# Patient Record
Sex: Male | Born: 1945
Health system: Southern US, Community
[De-identification: ages and names within clinical notes are randomized; demographics above are authoritative.]

## PROBLEM LIST (undated history)

## (undated) DIAGNOSIS — Z8739 Personal history of other diseases of the musculoskeletal system and connective tissue: Secondary | ICD-10-CM

## (undated) DIAGNOSIS — M549 Dorsalgia, unspecified: Secondary | ICD-10-CM

## (undated) DIAGNOSIS — Z515 Encounter for palliative care: Secondary | ICD-10-CM

## (undated) DIAGNOSIS — K219 Gastro-esophageal reflux disease without esophagitis: Secondary | ICD-10-CM

## (undated) DIAGNOSIS — G8929 Other chronic pain: Secondary | ICD-10-CM

## (undated) DIAGNOSIS — I10 Essential (primary) hypertension: Secondary | ICD-10-CM

## (undated) DIAGNOSIS — C029 Malignant neoplasm of tongue, unspecified: Secondary | ICD-10-CM

## (undated) DIAGNOSIS — M542 Cervicalgia: Secondary | ICD-10-CM

## (undated) DIAGNOSIS — M1990 Unspecified osteoarthritis, unspecified site: Secondary | ICD-10-CM

## (undated) HISTORY — PX: EYE SURGERY: SHX253

## (undated) HISTORY — PX: LUMBAR DISC SURGERY: SHX700

## (undated) HISTORY — PX: CERVICAL DISC SURGERY: SHX588

---
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
## 2013-09-21 ENCOUNTER — Encounter (HOSPITAL_COMMUNITY): Payer: Self-pay | Admitting: Pharmacy Technician

## 2013-09-24 NOTE — Patient Instructions (Addendum)
Your procedure is scheduled on:  10/02/2013  Report to [facility] at   6:15   AM.  Call this number if you have problems the morning of surgery: [phone]   Remember:   Do not eat or drink :After Midnight.    Take these medicines the morning of surgery with A SIP OF WATER: Amlodipine and Lisinopril   Do not wear jewelry, make-up or nail polish.  Do not wear lotions, powders, or perfumes. You may wear deodorant.  Do not bring valuables to the hospital.  Contacts, dentures or bridgework may not be worn into surgery.  Patients discharged the day of surgery will not be allowed to drive home    Please read over the following fact sheets that you were given: Pain Booklet, Surgical Site Infection Prevention, Anesthesia Post-op Instructions and Care and Recovery After Surgery   Cataract Surgery  A cataract is a clouding of the lens of the eye. When a lens becomes cloudy, vision is reduced based on the degree and nature of the clouding. Surgery may be needed to improve vision. Surgery removes the cloudy lens and usually replaces it with a substitute lens (intraocular lens, IOL). LET YOUR EYE DOCTOR KNOW ABOUT:  Allergies to food or medicine.   Medicines taken including herbs, eyedrops, over-the-counter medicines, and creams.   Use of steroids (by mouth or creams).   Previous problems with anesthetics or numbing medicine.   History of bleeding problems or blood clots.   Previous surgery.   Other health problems, including diabetes and kidney problems.   Possibility of pregnancy, if this applies.  RISKS AND COMPLICATIONS  Infection.   Inflammation of the eyeball (endophthalmitis) that can spread to both eyes (sympathetic ophthalmia).   Poor wound healing.   If an IOL is inserted, it can later fall out of proper position. This is very uncommon.   Clouding of the part of your eye that holds an IOL in place. This is called an "after-cataract." These are uncommon, but easily  treated.  BEFORE THE PROCEDURE  Do not eat or drink anything except small amounts of water for 8 to 12 before your surgery, or as directed by your caregiver.   Unless you are told otherwise, continue any eyedrops you have been prescribed.   Talk to your primary caregiver about all other medicines that you take (both prescription and non-prescription). In some cases, you may need to stop or change medicines near the time of your surgery. This is most important if you are taking blood-thinning medicine.Do not stop medicines unless you are told to do so.   Arrange for someone to drive you to and from the procedure.   Do not put contact lenses in either eye on the day of your surgery.  PROCEDURE There is more than one method for safely removing a cataract. Your doctor can explain the differences and help determine which is best for you. Phacoemulsification surgery is the most common form of cataract surgery.  An injection is given behind the eye or eyedrops are given to make this a painless procedure.   A small cut (incision) is made on the edge of the clear, dome-shaped surface that covers the front of the eye (cornea).   A tiny probe is painlessly inserted into the eye. This device gives off ultrasound waves that soften and break up the cloudy center of the lens. This makes it easier for the cloudy lens to be removed by suction.   An IOL may be  implanted.   The normal lens of the eye is covered by a clear capsule. Part of that capsule is intentionally left in the eye to support the IOL.   Your surgeon may or may not use stitches to close the incision.  There are other forms of cataract surgery that require a larger incision and stiches to close the eye. This approach is taken in cases where the doctor feels that the cataract cannot be easily removed using phacoemulsification. AFTER THE PROCEDURE  When an IOL is implanted, it does not need care. It becomes a permanent part of your eye  and cannot be seen or felt.   Your doctor will schedule follow-up exams to check on your progress.   Review your other medicines with your doctor to see which can be resumed after surgery.   Use eyedrops or take medicine as prescribed by your doctor.  Document Released: 08/26/2011 Document Reviewed: 08/23/2011 [facility] Patient Information 2012 [place].  .Cataract Surgery Care After Refer to this sheet in the next few weeks. These instructions provide you with information on caring for yourself after your procedure. Your caregiver may also give you more specific instructions. Your treatment has been planned according to current medical practices, but problems sometimes occur. Call your caregiver if you have any problems or questions after your procedure.  HOME CARE INSTRUCTIONS   Avoid strenuous activities as directed by your caregiver.   Ask your caregiver when you can resume driving.   Use eyedrops or other medicines to help healing and control pressure inside your eye as directed by your caregiver.   Only take over-the-counter or prescription medicines for pain, discomfort, or fever as directed by your caregiver.   Do not to touch or rub your eyes.   You may be instructed to use a protective shield during the first few days and nights after surgery. If not, wear sunglasses to protect your eyes. This is to protect the eye from pressure or from being accidentally bumped.   Keep the area around your eye clean and dry. Avoid swimming or allowing water to hit you directly in the face while showering. Keep soap and shampoo out of your eyes.   Do not bend or lift heavy objects. Bending increases pressure in the eye. You can walk, climb stairs, and do light household chores.   Do not put a contact lens into the eye that had surgery until your caregiver says it is okay to do so.   Ask your doctor when you can return to work. This will depend on the kind of work that you do. If you  work in a dusty environment, you may be advised to wear protective eyewear for a period of time.   Ask your caregiver when it will be safe to engage in sexual activity.   Continue with your regular eye exams as directed by your caregiver.  What to expect:  It is normal to feel itching and mild discomfort for a few days after cataract surgery. Some fluid discharge is also common, and your eye may be sensitive to light and touch.   After 1 to 2 days, even moderate discomfort should disappear. In most cases, healing will take about 6 weeks.   If you received an intraocular lens (IOL), you may notice that colors are very bright or have a blue tinge. Also, if you have been in bright sunlight, everything may appear reddish for a few hours. If you see these color tinges, it is because  your lens is clear and no longer cloudy. Within a few months after receiving an IOL, these extra colors should go away. When you have healed, you will probably need new glasses.  SEEK MEDICAL CARE IF:   You have increased bruising around your eye.   You have discomfort not helped by medicine.  SEEK IMMEDIATE MEDICAL CARE IF:   You have a fever.   You have a worsening or sudden vision loss.   You have redness, swelling, or increasing pain in the eye.   You have a thick discharge from the eye that had surgery.  MAKE SURE YOU:  Understand these instructions.   Will watch your condition.   Will get help right away if you are not doing well or get worse.  Document Released: 03/26/2005 Document Revised: 08/26/2011 Document Reviewed: 04/30/2011 [facility] Patient Information 2012 [place].

## 2013-09-25 ENCOUNTER — Encounter (HOSPITAL_COMMUNITY)
Admission: RE | Admit: 2013-09-25 | Discharge: 2013-09-25 | Disposition: A | Discharge status: Home / Self Care | Payer: Medicare Other | Source: Ambulatory Visit | Attending: Ophthalmology | Admitting: Ophthalmology

## 2013-09-25 ENCOUNTER — Encounter (HOSPITAL_COMMUNITY): Payer: Self-pay

## 2013-09-25 ENCOUNTER — Other Ambulatory Visit: Payer: Self-pay

## 2013-09-25 DIAGNOSIS — Z01818 Encounter for other preprocedural examination: Secondary | ICD-10-CM | POA: Insufficient documentation

## 2013-09-25 DIAGNOSIS — Z01810 Encounter for preprocedural cardiovascular examination: Secondary | ICD-10-CM | POA: Insufficient documentation

## 2013-09-25 DIAGNOSIS — Z01812 Encounter for preprocedural laboratory examination: Secondary | ICD-10-CM | POA: Insufficient documentation

## 2013-09-25 HISTORY — DX: Personal history of other diseases of the musculoskeletal system and connective tissue: Z87.39

## 2013-09-25 HISTORY — DX: Cervicalgia: M54.2

## 2013-09-25 HISTORY — DX: Other chronic pain: G89.29

## 2013-09-25 HISTORY — DX: Dorsalgia, unspecified: M54.9

## 2013-09-25 HISTORY — DX: Unspecified osteoarthritis, unspecified site: M19.90

## 2013-09-25 HISTORY — DX: Essential (primary) hypertension: I10

## 2013-09-25 LAB — BASIC METABOLIC PANEL
BUN: 12 mg/dL (ref 6–23)
CO2: 30 mEq/L (ref 19–32)
Calcium: 9.2 mg/dL (ref 8.4–10.5)
Chloride: 91 mEq/L — ABNORMAL LOW (ref 96–112)
Creatinine, Ser: 0.8 mg/dL (ref 0.50–1.35)
GFR calc Af Amer: >90 mL/min (ref >90)
Glucose, Bld: 97 mg/dL (ref 70–99)
POTASSIUM: 5.1 meq/L (ref 3.7–5.3)
SODIUM: 130 meq/L — AB (ref 137–147)

## 2013-09-25 LAB — CBC WITH DIFFERENTIAL/PLATELET
BASOS PCT: 0 % (ref 0–1)
Basophils Absolute: 0 10*3/uL (ref 0.0–0.1)
Eosinophils Absolute: 0.1 10*3/uL (ref 0.0–0.7)
Eosinophils Relative: 1 % (ref 0–5)
HCT: 43.4 % (ref 39.0–52.0)
Hemoglobin: 15.4 g/dL (ref 13.0–17.0)
Lymphocytes Relative: 23 % (ref 12–46)
Lymphs Abs: 1.9 10*3/uL (ref 0.7–4.0)
MCH: 35.3 pg — ABNORMAL HIGH (ref 26.0–34.0)
MCHC: 35.5 g/dL (ref 30.0–36.0)
MCV: 99.5 fL (ref 78.0–100.0)
Monocytes Absolute: 0.6 10*3/uL (ref 0.1–1.0)
Monocytes Relative: 7 % (ref 3–12)
NEUTROS PCT: 69 % (ref 43–77)
Neutro Abs: 5.8 10*3/uL (ref 1.7–7.7)
PLATELETS: 294 10*3/uL (ref 150–400)
RBC: 4.36 MIL/uL (ref 4.22–5.81)
RDW: 13.4 % (ref 11.5–15.5)
WBC: 8.4 10*3/uL (ref 4.0–10.5)

## 2013-09-25 NOTE — Progress Notes (Signed)
09/25/13 0914  OBSTRUCTIVE SLEEP APNEA  Have you ever been diagnosed with sleep apnea through a sleep study? No  Do you snore loudly (loud enough to be heard through closed doors)?  0  Do you often feel tired, fatigued, or sleepy during the daytime? 0  Has anyone observed you stop breathing during your sleep? 1  Do you have, or are you being treated for high blood pressure? 1  BMI more than 35 kg/m2? 0  Age over 66 years old? 1  Neck circumference greater than 40 cm/18 inches? 0  Gender: 1  Obstructive Sleep Apnea Score 4  Score 4 or greater  Results sent to PCP

## 2013-10-01 ENCOUNTER — Encounter (HOSPITAL_COMMUNITY): Payer: Self-pay | Admitting: Pharmacy Technician

## 2013-10-01 MED ORDER — TETRACAINE HCL 0.5 % OP SOLN
OPHTHALMIC | Status: AC
  Filled 2013-10-01: qty 2

## 2013-10-01 MED ORDER — PHENYLEPHRINE HCL 2.5 % OP SOLN
OPHTHALMIC | Status: AC
  Filled 2013-10-01: qty 15

## 2013-10-01 MED ORDER — CYCLOPENTOLATE-PHENYLEPHRINE OP SOLN OPTIME - NO CHARGE
OPHTHALMIC | Status: AC
  Filled 2013-10-01: qty 2

## 2013-10-01 MED ORDER — KETOROLAC TROMETHAMINE 0.5 % OP SOLN
OPHTHALMIC | Status: AC
  Filled 2013-10-01: qty 5

## 2013-10-02 ENCOUNTER — Encounter (HOSPITAL_COMMUNITY): Payer: Medicare Other | Admitting: Anesthesiology

## 2013-10-02 ENCOUNTER — Encounter (HOSPITAL_COMMUNITY): Payer: Self-pay | Admitting: *Deleted

## 2013-10-02 ENCOUNTER — Ambulatory Visit (HOSPITAL_COMMUNITY)
Admission: RE | Admit: 2013-10-02 | Discharge: 2013-10-02 | Disposition: A | Discharge status: Home / Self Care | Payer: Medicare Other | Source: Ambulatory Visit | Attending: Ophthalmology | Admitting: Ophthalmology

## 2013-10-02 ENCOUNTER — Encounter (HOSPITAL_COMMUNITY)
Admission: RE | Disposition: A | Discharge status: Home / Self Care | Payer: Self-pay | Source: Ambulatory Visit | Attending: Ophthalmology

## 2013-10-02 ENCOUNTER — Ambulatory Visit (HOSPITAL_COMMUNITY): Payer: Medicare Other | Admitting: Anesthesiology

## 2013-10-02 DIAGNOSIS — I10 Essential (primary) hypertension: Secondary | ICD-10-CM | POA: Insufficient documentation

## 2013-10-02 DIAGNOSIS — J449 Chronic obstructive pulmonary disease, unspecified: Secondary | ICD-10-CM | POA: Insufficient documentation

## 2013-10-02 DIAGNOSIS — H2510 Age-related nuclear cataract, unspecified eye: Secondary | ICD-10-CM | POA: Insufficient documentation

## 2013-10-02 DIAGNOSIS — J4489 Other specified chronic obstructive pulmonary disease: Secondary | ICD-10-CM | POA: Insufficient documentation

## 2013-10-02 HISTORY — PX: CATARACT EXTRACTION W/PHACO: SHX586

## 2013-10-02 SURGERY — PHACOEMULSIFICATION, CATARACT, WITH IOL INSERTION
Anesthesia: Monitor Anesthesia Care | Site: Eye | Laterality: Left

## 2013-10-02 MED ORDER — MIDAZOLAM HCL 2 MG/2ML IJ SOLN
1.0000 mg | INTRAMUSCULAR | Status: DC | PRN
  Administered 2013-10-02: 2 mg via INTRAVENOUS

## 2013-10-02 MED ORDER — CYCLOPENTOLATE-PHENYLEPHRINE 0.2-1 % OP SOLN
1.0000 [drp] | OPHTHALMIC | Status: AC
  Administered 2013-10-02 (×3): 1 [drp] via OPHTHALMIC

## 2013-10-02 MED ORDER — FENTANYL CITRATE 0.05 MG/ML IJ SOLN
INTRAMUSCULAR | Status: DC | PRN
  Administered 2013-10-02 (×2): 25 ug via INTRAVENOUS

## 2013-10-02 MED ORDER — BSS IO SOLN
INTRAOCULAR | Status: DC | PRN
  Administered 2013-10-02: 15 mL via INTRAOCULAR

## 2013-10-02 MED ORDER — BSS IO SOLN
INTRAOCULAR | Status: DC | PRN
  Administered 2013-10-02: 08:00:00

## 2013-10-02 MED ORDER — LIDOCAINE HCL (PF) 1 % IJ SOLN
INTRAMUSCULAR | Status: AC
  Filled 2013-10-02: qty 2

## 2013-10-02 MED ORDER — TETRACAINE HCL 0.5 % OP SOLN
1.0000 [drp] | OPHTHALMIC | Status: AC
  Administered 2013-10-02 (×3): 1 [drp] via OPHTHALMIC

## 2013-10-02 MED ORDER — LACTATED RINGERS IV SOLN
INTRAVENOUS | Status: DC
  Administered 2013-10-02: 07:00:00 via INTRAVENOUS

## 2013-10-02 MED ORDER — PROVISC 10 MG/ML IO SOLN
INTRAOCULAR | Status: DC | PRN
  Administered 2013-10-02: 0.85 mL via INTRAOCULAR

## 2013-10-02 MED ORDER — MIDAZOLAM HCL 2 MG/2ML IJ SOLN
INTRAMUSCULAR | Status: AC
  Filled 2013-10-02: qty 2

## 2013-10-02 MED ORDER — PHENYLEPHRINE HCL 2.5 % OP SOLN
1.0000 [drp] | OPHTHALMIC | Status: AC
  Administered 2013-10-02 (×3): 1 [drp] via OPHTHALMIC

## 2013-10-02 MED ORDER — TETRACAINE 0.5 % OP SOLN OPTIME - NO CHARGE
OPHTHALMIC | Status: DC | PRN
  Administered 2013-10-02: 1 [drp] via OPHTHALMIC

## 2013-10-02 MED ORDER — GLYCOPYRROLATE 0.2 MG/ML IJ SOLN
0.2000 mg | Freq: Once | INTRAMUSCULAR | Status: AC
  Administered 2013-10-02: 0.2 mg via INTRAVENOUS

## 2013-10-02 MED ORDER — KETOROLAC TROMETHAMINE 0.5 % OP SOLN
1.0000 [drp] | OPHTHALMIC | Status: AC
  Administered 2013-10-02 (×3): 1 [drp] via OPHTHALMIC

## 2013-10-02 MED ORDER — FENTANYL CITRATE 0.05 MG/ML IJ SOLN
25.0000 ug | INTRAMUSCULAR | Status: AC
  Administered 2013-10-02 (×2): 25 ug via INTRAVENOUS

## 2013-10-02 MED ORDER — GLYCOPYRROLATE 0.2 MG/ML IJ SOLN
INTRAMUSCULAR | Status: AC
  Filled 2013-10-02: qty 1

## 2013-10-02 MED ORDER — EPINEPHRINE HCL 1 MG/ML IJ SOLN
INTRAMUSCULAR | Status: AC
  Filled 2013-10-02: qty 1

## 2013-10-02 MED ORDER — FENTANYL CITRATE 0.05 MG/ML IJ SOLN
INTRAMUSCULAR | Status: AC
  Filled 2013-10-02: qty 2

## 2013-10-02 SURGICAL SUPPLY — 24 items
CAPSULAR TENSION RING-AMO (OPHTHALMIC RELATED) IMPLANT
CLOTH BEACON ORANGE TIMEOUT ST (SAFETY) ×3 IMPLANT
EYE SHIELD UNIVERSAL CLEAR (GAUZE/BANDAGES/DRESSINGS) ×3 IMPLANT
GLOVE BIO SURGEON STRL SZ 6.5 (GLOVE) ×2 IMPLANT
GLOVE BIO SURGEONS STRL SZ 6.5 (GLOVE) ×1
GLOVE ECLIPSE 6.5 STRL STRAW (GLOVE) IMPLANT
GLOVE ECLIPSE 7.0 STRL STRAW (GLOVE) IMPLANT
GLOVE EXAM NITRILE LRG STRL (GLOVE) ×3 IMPLANT
GLOVE EXAM NITRILE MD LF STRL (GLOVE) IMPLANT
GLOVE SKINSENSE NS SZ6.5 (GLOVE)
GLOVE SKINSENSE STRL SZ6.5 (GLOVE) IMPLANT
HEALON 5 0.6 ML (INTRAOCULAR LENS) IMPLANT
KIT VITRECTOMY (OPHTHALMIC RELATED) IMPLANT
PAD ARMBOARD 7.5X6 YLW CONV (MISCELLANEOUS) ×3 IMPLANT
PROC W NO LENS (INTRAOCULAR LENS)
PROC W SPEC LENS (INTRAOCULAR LENS)
PROCESS W NO LENS (INTRAOCULAR LENS) IMPLANT
PROCESS W SPEC LENS (INTRAOCULAR LENS) IMPLANT
RING MALYGIN (MISCELLANEOUS) IMPLANT
SIGHTPATH CAT PROC W REG LENS (Ophthalmic Related) ×3 IMPLANT
TAPE SURG TRANSPARENT 2IN (GAUZE/BANDAGES/DRESSINGS) ×1 IMPLANT
TAPE TRANSPARENT 2IN (GAUZE/BANDAGES/DRESSINGS) ×2
VISCOELASTIC ADDITIONAL (OPHTHALMIC RELATED) IMPLANT
WATER STERILE IRR 250ML POUR (IV SOLUTION) ×3 IMPLANT

## 2013-10-02 NOTE — H&P (Signed)
The patient was re examined and there is no change in the patients condition since the original H and P. 

## 2013-10-02 NOTE — Anesthesia Postprocedure Evaluation (Signed)
  Anesthesia Post-op Note  Patient: Kevin Durham  Procedure(s) Performed: Procedure(s) (LRB): CATARACT EXTRACTION PHACO AND INTRAOCULAR LENS PLACEMENT (IOC) (Left)  Patient Location:  Short Stay  Anesthesia Type: MAC  Level of Consciousness: awake  Airway and Oxygen Therapy: Patient Spontanous Breathing  Post-op Pain: none  Post-op Assessment: Post-op Vital signs reviewed, Patient's Cardiovascular Status Stable, Respiratory Function Stable, Patent Airway, No signs of Nausea or vomiting and Pain level controlled  Post-op Vital Signs: Reviewed and stable  Complications: No apparent anesthesia complications

## 2013-10-02 NOTE — Transfer of Care (Signed)
Immediate Anesthesia Transfer of Care Note  Patient: Kevin Durham  Procedure(s) Performed: Procedure(s) (LRB): CATARACT EXTRACTION PHACO AND INTRAOCULAR LENS PLACEMENT (IOC) (Left)  Patient Location: Shortstay  Anesthesia Type: MAC  Level of Consciousness: awake  Airway & Oxygen Therapy: Patient Spontanous Breathing   Post-op Assessment: Report given to PACU RN, Post -op Vital signs reviewed and stable and Patient moving all extremities  Post vital signs: Reviewed and stable  Complications: No apparent anesthesia complications

## 2013-10-02 NOTE — Anesthesia Preprocedure Evaluation (Addendum)
Anesthesia Evaluation  Patient identified by MRN, date of birth, ID band Patient awake    Reviewed: Allergy & Precautions, H&P , NPO status , Patient's Chart, lab work & pertinent test results  Airway Mallampati: II TM Distance: >3 FB Neck ROM: Limited    Dental  (+) Teeth Intact   Pulmonary COPDCurrent Smoker,  breath sounds clear to auscultation        Cardiovascular hypertension, Pt. on medications Rhythm:Regular Rate:Normal     Neuro/Psych    GI/Hepatic negative GI ROS,   Endo/Other    Renal/GU      Musculoskeletal   Abdominal   Peds  Hematology   Anesthesia Other Findings Gout   Reproductive/Obstetrics                          Anesthesia Physical Anesthesia Plan  ASA: III  Anesthesia Plan: MAC   Post-op Pain Management:    Induction: Intravenous  Airway Management Planned: Nasal Cannula  Additional Equipment:   Intra-op Plan:   Post-operative Plan:   Informed Consent: I have reviewed the patients History and Physical, chart, labs and discussed the procedure including the risks, benefits and alternatives for the proposed anesthesia with the patient or authorized representative who has indicated his/her understanding and acceptance.     Plan Discussed with:   Anesthesia Plan Comments:         Anesthesia Quick Evaluation

## 2013-10-02 NOTE — Anesthesia Procedure Notes (Signed)
Procedure Name: MAC Date/Time: 10/02/2013 7:25 AM Performed by: Vista Deck Pre-anesthesia Checklist: Patient identified, Emergency Drugs available, Suction available, Timeout performed and Patient being monitored Patient Re-evaluated:Patient Re-evaluated prior to inductionOxygen Delivery Method: Nasal Cannula

## 2013-10-02 NOTE — Op Note (Signed)
Patient brought to the operating room and prepped and draped in the usual manner.  Lid speculum inserted in left eye.  Stab incision made at the twelve o'clock position.  Provisc instilled in the anterior chamber.   A 2.4 mm. Stab incision was made temporally.  An anterior capsulotomy was done with a bent 25 gauge needle.  The nucleus was hydrodissected.  The Phaco tip was inserted in the anterior chamber and the nucleus was emulsified.  CDE was 18.90.  The cortical material was then removed with the I and A tip.  Posterior capsule was the polished.  The anterior chamber was deepened with Provisc.  A 14.5 Diopter Rayner 570C IOL was then inserted in the capsular bag.  Provisc was then removed with the I and A tip.  The wound was then hydrated.  Patient sent to the Recovery Room in good condition with follow up in my office.  Preoperative Diagnosis:  Nuclear Cataract OS Postoperative Diagnosis:  Same Procedure name: Kelman Phacoemulsification OS with IOL

## 2013-10-02 NOTE — Discharge Instructions (Signed)
Kevin Durham  10/02/2013           [facility] Instructions [place] [address]      1. Avoid closing eyes tightly. One often closes the eye tightly when laughing, talking, sneezing, coughing or if they feel irritated. At these times, you should be careful not to close your eyes tightly.  2. Instill eye drops as instructed. To instill drops in your eye, open it, look up and have someone gently pull the lower lid down and instill a couple of drops inside the lower lid.  3. Do not touch upper lid.  4. Take Advil or Tylenol for pain.  5. You may use either eye for near work, such as reading or sewing and you may watch television.  6. You may have your hair done at the beauty parlor at any time.  7. Wear dark glasses with or without your own glasses if you are in bright light.  8. Call our office at [phone] or [phone] if you have sharp pain in your eye or unusual symptoms.  9. Do not be concerned because vision in the operative eye is not good. It will not be good, no matter how successful the operation, until you get a special lens for it. Your old glasses will not be suited to the new eye that was operated on and you will not be ready for a new lens for about a month.  10. Follow up at the [facility] office.    I have received a copy of the above instructions and will follow them.

## 2013-10-03 ENCOUNTER — Encounter (HOSPITAL_COMMUNITY): Payer: Self-pay | Admitting: Ophthalmology

## 2013-10-09 ENCOUNTER — Encounter (HOSPITAL_COMMUNITY)
Admission: RE | Admit: 2013-10-09 | Discharge: 2013-10-09 | Disposition: A | Discharge status: Home / Self Care | Payer: Medicare Other | Source: Ambulatory Visit | Attending: Ophthalmology | Admitting: Ophthalmology

## 2013-10-09 ENCOUNTER — Encounter (HOSPITAL_COMMUNITY): Payer: Self-pay

## 2013-10-09 MED ORDER — ONDANSETRON HCL 4 MG/2ML IJ SOLN
4.0000 mg | Freq: Once | INTRAMUSCULAR | Status: AC | PRN
Start: 2013-10-09 — End: 2013-10-09

## 2013-10-09 MED ORDER — FENTANYL CITRATE 0.05 MG/ML IJ SOLN
25.0000 ug | INTRAMUSCULAR | Status: DC | PRN
  Administered 2013-10-16: 25 ug via INTRAVENOUS

## 2013-10-09 MED ORDER — LACTATED RINGERS IV SOLN
INTRAVENOUS | Status: DC
Start: 2013-10-09 — End: 2013-10-16
  Administered 2013-10-16: 1000 mL via INTRAVENOUS

## 2013-10-15 MED ORDER — KETOROLAC TROMETHAMINE 0.5 % OP SOLN
OPHTHALMIC | Status: AC
  Filled 2013-10-15: qty 5

## 2013-10-15 MED ORDER — TETRACAINE HCL 0.5 % OP SOLN
OPHTHALMIC | Status: AC
  Filled 2013-10-15: qty 2

## 2013-10-15 MED ORDER — PHENYLEPHRINE HCL 2.5 % OP SOLN
OPHTHALMIC | Status: AC
  Filled 2013-10-15: qty 15

## 2013-10-15 MED ORDER — CYCLOPENTOLATE-PHENYLEPHRINE OP SOLN OPTIME - NO CHARGE
OPHTHALMIC | Status: AC
  Filled 2013-10-15: qty 2

## 2013-10-16 ENCOUNTER — Encounter (HOSPITAL_COMMUNITY): Payer: Self-pay | Admitting: *Deleted

## 2013-10-16 ENCOUNTER — Ambulatory Visit (HOSPITAL_COMMUNITY)
Admission: RE | Admit: 2013-10-16 | Discharge: 2013-10-16 | Disposition: A | Discharge status: Home / Self Care | Payer: Medicare Other | Source: Ambulatory Visit | Attending: Ophthalmology | Admitting: Ophthalmology

## 2013-10-16 ENCOUNTER — Ambulatory Visit (HOSPITAL_COMMUNITY): Payer: Medicare Other | Admitting: Anesthesiology

## 2013-10-16 ENCOUNTER — Encounter (HOSPITAL_COMMUNITY)
Admission: RE | Disposition: A | Discharge status: Home / Self Care | Payer: Self-pay | Source: Ambulatory Visit | Attending: Ophthalmology

## 2013-10-16 ENCOUNTER — Encounter (HOSPITAL_COMMUNITY): Payer: Medicare Other | Admitting: Anesthesiology

## 2013-10-16 DIAGNOSIS — J449 Chronic obstructive pulmonary disease, unspecified: Secondary | ICD-10-CM | POA: Insufficient documentation

## 2013-10-16 DIAGNOSIS — H2510 Age-related nuclear cataract, unspecified eye: Secondary | ICD-10-CM | POA: Insufficient documentation

## 2013-10-16 DIAGNOSIS — I10 Essential (primary) hypertension: Secondary | ICD-10-CM | POA: Insufficient documentation

## 2013-10-16 DIAGNOSIS — F17200 Nicotine dependence, unspecified, uncomplicated: Secondary | ICD-10-CM | POA: Insufficient documentation

## 2013-10-16 DIAGNOSIS — J4489 Other specified chronic obstructive pulmonary disease: Secondary | ICD-10-CM | POA: Insufficient documentation

## 2013-10-16 HISTORY — PX: CATARACT EXTRACTION W/PHACO: SHX586

## 2013-10-16 SURGERY — PHACOEMULSIFICATION, CATARACT, WITH IOL INSERTION
Anesthesia: Monitor Anesthesia Care | Site: Eye | Laterality: Right

## 2013-10-16 MED ORDER — PHENYLEPHRINE HCL 2.5 % OP SOLN
1.0000 [drp] | OPHTHALMIC | Status: AC
  Administered 2013-10-16 (×3): 1 [drp] via OPHTHALMIC

## 2013-10-16 MED ORDER — FENTANYL CITRATE 0.05 MG/ML IJ SOLN: 25.0000 ug | INTRAMUSCULAR | Status: DC | PRN

## 2013-10-16 MED ORDER — EPINEPHRINE HCL 1 MG/ML IJ SOLN
INTRAMUSCULAR | Status: AC
  Filled 2013-10-16: qty 1

## 2013-10-16 MED ORDER — FENTANYL CITRATE 0.05 MG/ML IJ SOLN
INTRAMUSCULAR | Status: AC
  Filled 2013-10-16: qty 2

## 2013-10-16 MED ORDER — ONDANSETRON HCL 4 MG/2ML IJ SOLN: 4.0000 mg | Freq: Once | INTRAMUSCULAR | Status: DC | PRN

## 2013-10-16 MED ORDER — TETRACAINE 0.5 % OP SOLN OPTIME - NO CHARGE
OPHTHALMIC | Status: DC | PRN
  Administered 2013-10-16: 1 [drp] via OPHTHALMIC

## 2013-10-16 MED ORDER — FENTANYL CITRATE 0.05 MG/ML IJ SOLN
INTRAMUSCULAR | Status: AC
Start: 2013-10-16 — End: 2013-10-16
  Filled 2013-10-16: qty 2

## 2013-10-16 MED ORDER — KETOROLAC TROMETHAMINE 0.5 % OP SOLN
1.0000 [drp] | OPHTHALMIC | Status: AC
  Administered 2013-10-16 (×3): 1 [drp] via OPHTHALMIC

## 2013-10-16 MED ORDER — BSS IO SOLN
INTRAOCULAR | Status: DC | PRN
  Administered 2013-10-16: 15 mL via INTRAOCULAR

## 2013-10-16 MED ORDER — MIDAZOLAM HCL 2 MG/2ML IJ SOLN
1.0000 mg | INTRAMUSCULAR | Status: DC | PRN
  Administered 2013-10-16: 2 mg via INTRAVENOUS

## 2013-10-16 MED ORDER — LACTATED RINGERS IV SOLN: INTRAVENOUS | Status: DC

## 2013-10-16 MED ORDER — GLYCOPYRROLATE 0.2 MG/ML IJ SOLN
0.1000 mg | Freq: Once | INTRAMUSCULAR | Status: AC
  Administered 2013-10-16: 0.1 mg via INTRAVENOUS

## 2013-10-16 MED ORDER — TETRACAINE HCL 0.5 % OP SOLN
1.0000 [drp] | OPHTHALMIC | Status: AC
  Administered 2013-10-16 (×3): 1 [drp] via OPHTHALMIC

## 2013-10-16 MED ORDER — FENTANYL CITRATE 0.05 MG/ML IJ SOLN
25.0000 ug | INTRAMUSCULAR | Status: AC
  Administered 2013-10-16 (×3): 25 ug via INTRAVENOUS

## 2013-10-16 MED ORDER — MIDAZOLAM HCL 2 MG/2ML IJ SOLN
INTRAMUSCULAR | Status: AC
  Filled 2013-10-16: qty 2

## 2013-10-16 MED ORDER — GLYCOPYRROLATE 0.2 MG/ML IJ SOLN
INTRAMUSCULAR | Status: AC
  Filled 2013-10-16: qty 1

## 2013-10-16 MED ORDER — CYCLOPENTOLATE-PHENYLEPHRINE 0.2-1 % OP SOLN
1.0000 [drp] | OPHTHALMIC | Status: AC
  Administered 2013-10-16 (×3): 1 [drp] via OPHTHALMIC

## 2013-10-16 MED ORDER — EPINEPHRINE HCL 1 MG/ML IJ SOLN
INTRAMUSCULAR | Status: DC | PRN
  Administered 2013-10-16: 08:00:00

## 2013-10-16 MED ORDER — PROVISC 10 MG/ML IO SOLN
INTRAOCULAR | Status: DC | PRN
  Administered 2013-10-16: 0.85 mL via INTRAOCULAR

## 2013-10-16 MED ORDER — LIDOCAINE HCL (PF) 1 % IJ SOLN
INTRAMUSCULAR | Status: AC
  Filled 2013-10-16: qty 2

## 2013-10-16 SURGICAL SUPPLY — 24 items
CAPSULAR TENSION RING-AMO (OPHTHALMIC RELATED) IMPLANT
CLOTH BEACON ORANGE TIMEOUT ST (SAFETY) ×3 IMPLANT
EYE SHIELD UNIVERSAL CLEAR (GAUZE/BANDAGES/DRESSINGS) ×3 IMPLANT
GLOVE BIO SURGEON STRL SZ 6.5 (GLOVE) ×2 IMPLANT
GLOVE BIO SURGEONS STRL SZ 6.5 (GLOVE) ×1
GLOVE ECLIPSE 6.5 STRL STRAW (GLOVE) IMPLANT
GLOVE ECLIPSE 7.0 STRL STRAW (GLOVE) IMPLANT
GLOVE EXAM NITRILE LRG STRL (GLOVE) IMPLANT
GLOVE EXAM NITRILE MD LF STRL (GLOVE) ×3 IMPLANT
GLOVE SKINSENSE NS SZ6.5 (GLOVE)
GLOVE SKINSENSE STRL SZ6.5 (GLOVE) IMPLANT
HEALON 5 0.6 ML (INTRAOCULAR LENS) IMPLANT
KIT VITRECTOMY (OPHTHALMIC RELATED) IMPLANT
PAD ARMBOARD 7.5X6 YLW CONV (MISCELLANEOUS) ×3 IMPLANT
PROC W NO LENS (INTRAOCULAR LENS)
PROC W SPEC LENS (INTRAOCULAR LENS)
PROCESS W NO LENS (INTRAOCULAR LENS) IMPLANT
PROCESS W SPEC LENS (INTRAOCULAR LENS) IMPLANT
RING MALYGIN (MISCELLANEOUS) IMPLANT
SIGHTPATH CAT PROC W REG LENS (Ophthalmic Related) ×3 IMPLANT
TAPE SURG TRANSPORE 1 IN (GAUZE/BANDAGES/DRESSINGS) ×1 IMPLANT
TAPE SURGICAL TRANSPORE 1 IN (GAUZE/BANDAGES/DRESSINGS) ×2
VISCOELASTIC ADDITIONAL (OPHTHALMIC RELATED) IMPLANT
WATER STERILE IRR 250ML POUR (IV SOLUTION) ×3 IMPLANT

## 2013-10-16 NOTE — Preoperative (Signed)
Beta Blockers   Reason not to administer Beta Blockers:Not Applicable 

## 2013-10-16 NOTE — Transfer of Care (Signed)
Immediate Anesthesia Transfer of Care Note  Patient: Kevin Durham  Procedure(s) Performed: Procedure(s) with comments: CATARACT EXTRACTION PHACO AND INTRAOCULAR LENS PLACEMENT (IOC) (Right) - CDE:13.69  Patient Location: Short Stay  Anesthesia Type:MAC  Level of Consciousness: awake, alert , oriented and patient cooperative  Airway & Oxygen Therapy: Patient Spontanous Breathing  Post-op Assessment: Report given to PACU RN, Post -op Vital signs reviewed and stable and Patient moving all extremities  Post vital signs: Reviewed and stable  Complications: No apparent anesthesia complications

## 2013-10-16 NOTE — Discharge Instructions (Signed)
PARRY PO  10/16/2013           [facility] Instructions [place] [address]      1. Avoid closing eyes tightly. One often closes the eye tightly when laughing, talking, sneezing, coughing or if they feel irritated. At these times, you should be careful not to close your eyes tightly.  2. Instill eye drops as instructed. To instill drops in your eye, open it, look up and have someone gently pull the lower lid down and instill a couple of drops inside the lower lid.  3. Do not touch upper lid.  4. Take Advil or Tylenol for pain.  5. You may use either eye for near work, such as reading or sewing and you may watch television.  6. You may have your hair done at the beauty parlor at any time.  7. Wear dark glasses with or without your own glasses if you are in bright light.  8. Call our office at [phone] or [phone] if you have sharp pain in your eye or unusual symptoms.  9. Do not be concerned because vision in the operative eye is not good. It will not be good, no matter how successful the operation, until you get a special lens for it. Your old glasses will not be suited to the new eye that was operated on and you will not be ready for a new lens for about a month.  10. Follow up at the [facility] office.    I have received a copy of the above instructions and will follow them.      Follow up with Dr. Gershon Crane today at 2:30 pm

## 2013-10-16 NOTE — H&P (Signed)
The patient was re examined and there is no change in the patients condition since the original H and P. 

## 2013-10-16 NOTE — Anesthesia Postprocedure Evaluation (Signed)
  Anesthesia Post-op Note  Patient: Kevin Durham  Procedure(s) Performed: Procedure(s) with comments: CATARACT EXTRACTION PHACO AND INTRAOCULAR LENS PLACEMENT (IOC) (Right) - CDE:13.69  Patient Location: Short Stay  Anesthesia Type:MAC  Level of Consciousness: awake, alert , oriented and patient cooperative  Airway and Oxygen Therapy: Patient Spontanous Breathing  Post-op Pain: none  Post-op Assessment: Post-op Vital signs reviewed, Patient's Cardiovascular Status Stable, Respiratory Function Stable, Patent Airway and Pain level controlled  Post-op Vital Signs: Reviewed and stable  Complications: No apparent anesthesia complications

## 2013-10-16 NOTE — Op Note (Signed)
Patient brought to the operating room and prepped and draped in the usual manner.  Lid speculum inserted in right eye.  Stab incision made at the twelve o'clock position.  Provisc instilled in the anterior chamber.   A 2.4 mm. Stab incision was made temporally.  An anterior capsulotomy was done with a bent 25 gauge needle.  The nucleus was hydrodissected.  The Phaco tip was inserted in the anterior chamber and the nucleus was emulsified.  CDE was 13.69.  The cortical material was then removed with the I and A tip.  Posterior capsule was the polished.  The anterior chamber was deepened with Provisc.  A 15.0 Diopter Rayner 570C IOL was then inserted in the capsular bag.  Provisc was then removed with the I and A tip.  The wound was then hydrated.  Patient sent to the Recovery Room in good condition with follow up in my office.  Preoperative Diagnosis:  Nuclear Cataract OD Postoperative Diagnosis:  Same Procedure name: Kelman Phacoemulsification OD with IOL

## 2013-10-16 NOTE — Anesthesia Preprocedure Evaluation (Signed)
Anesthesia Evaluation  Patient identified by MRN, date of birth, ID band Patient awake    Reviewed: Allergy & Precautions, H&P , NPO status , Patient's Chart, lab work & pertinent test results  Airway Mallampati: II TM Distance: >3 FB Neck ROM: Limited    Dental  (+) Teeth Intact   Pulmonary COPDCurrent Smoker,  breath sounds clear to auscultation        Cardiovascular hypertension, Pt. on medications Rhythm:Regular Rate:Normal     Neuro/Psych    GI/Hepatic negative GI ROS,   Endo/Other    Renal/GU      Musculoskeletal   Abdominal   Peds  Hematology   Anesthesia Other Findings Gout   Reproductive/Obstetrics                          Anesthesia Physical Anesthesia Plan  ASA: III  Anesthesia Plan: MAC   Post-op Pain Management:    Induction: Intravenous  Airway Management Planned: Nasal Cannula  Additional Equipment:   Intra-op Plan:   Post-operative Plan:   Informed Consent: I have reviewed the patients History and Physical, chart, labs and discussed the procedure including the risks, benefits and alternatives for the proposed anesthesia with the patient or authorized representative who has indicated his/her understanding and acceptance.     Plan Discussed with:   Anesthesia Plan Comments:         Anesthesia Quick Evaluation  

## 2013-10-17 ENCOUNTER — Encounter (HOSPITAL_COMMUNITY): Payer: Self-pay | Admitting: Ophthalmology

## 2015-01-14 DIAGNOSIS — Z23 Encounter for immunization: Secondary | ICD-10-CM | POA: Diagnosis not present

## 2015-01-14 DIAGNOSIS — D692 Other nonthrombocytopenic purpura: Secondary | ICD-10-CM | POA: Diagnosis not present

## 2015-01-14 DIAGNOSIS — I10 Essential (primary) hypertension: Secondary | ICD-10-CM | POA: Diagnosis not present

## 2015-01-14 DIAGNOSIS — M6530 Trigger finger, unspecified finger: Secondary | ICD-10-CM | POA: Diagnosis not present

## 2015-05-16 DIAGNOSIS — R490 Dysphonia: Secondary | ICD-10-CM | POA: Diagnosis not present

## 2015-05-16 DIAGNOSIS — Z6822 Body mass index (BMI) 22.0-22.9, adult: Secondary | ICD-10-CM | POA: Diagnosis not present

## 2015-06-04 DIAGNOSIS — D380 Neoplasm of uncertain behavior of larynx: Secondary | ICD-10-CM | POA: Diagnosis not present

## 2015-06-04 DIAGNOSIS — R490 Dysphonia: Secondary | ICD-10-CM | POA: Diagnosis not present

## 2015-06-09 ENCOUNTER — Other Ambulatory Visit: Payer: Self-pay | Admitting: Otolaryngology

## 2015-06-09 ENCOUNTER — Encounter (HOSPITAL_BASED_OUTPATIENT_CLINIC_OR_DEPARTMENT_OTHER): Payer: Self-pay | Admitting: *Deleted

## 2015-06-10 ENCOUNTER — Ambulatory Visit (HOSPITAL_BASED_OUTPATIENT_CLINIC_OR_DEPARTMENT_OTHER): Payer: Medicare Other | Admitting: Anesthesiology

## 2015-06-10 ENCOUNTER — Encounter (HOSPITAL_BASED_OUTPATIENT_CLINIC_OR_DEPARTMENT_OTHER)
Admission: RE | Disposition: A | Discharge status: Home / Self Care | Payer: Self-pay | Source: Ambulatory Visit | Attending: Otolaryngology

## 2015-06-10 ENCOUNTER — Ambulatory Visit (HOSPITAL_BASED_OUTPATIENT_CLINIC_OR_DEPARTMENT_OTHER)
Admission: RE | Admit: 2015-06-10 | Discharge: 2015-06-10 | Disposition: A | Discharge status: Home / Self Care | Payer: Medicare Other | Source: Ambulatory Visit | Attending: Otolaryngology | Admitting: Otolaryngology

## 2015-06-10 ENCOUNTER — Encounter (HOSPITAL_BASED_OUTPATIENT_CLINIC_OR_DEPARTMENT_OTHER): Payer: Self-pay | Admitting: *Deleted

## 2015-06-10 DIAGNOSIS — I10 Essential (primary) hypertension: Secondary | ICD-10-CM | POA: Insufficient documentation

## 2015-06-10 DIAGNOSIS — H269 Unspecified cataract: Secondary | ICD-10-CM | POA: Insufficient documentation

## 2015-06-10 DIAGNOSIS — K219 Gastro-esophageal reflux disease without esophagitis: Secondary | ICD-10-CM | POA: Diagnosis not present

## 2015-06-10 DIAGNOSIS — C320 Malignant neoplasm of glottis: Secondary | ICD-10-CM | POA: Diagnosis not present

## 2015-06-10 DIAGNOSIS — F17210 Nicotine dependence, cigarettes, uncomplicated: Secondary | ICD-10-CM | POA: Diagnosis not present

## 2015-06-10 DIAGNOSIS — D380 Neoplasm of uncertain behavior of larynx: Secondary | ICD-10-CM | POA: Diagnosis not present

## 2015-06-10 DIAGNOSIS — R490 Dysphonia: Secondary | ICD-10-CM | POA: Diagnosis not present

## 2015-06-10 DIAGNOSIS — J383 Other diseases of vocal cords: Secondary | ICD-10-CM | POA: Diagnosis not present

## 2015-06-10 HISTORY — DX: Gastro-esophageal reflux disease without esophagitis: K21.9

## 2015-06-10 HISTORY — PX: MICROLARYNGOSCOPY: SHX5208

## 2015-06-10 LAB — POCT I-STAT, CHEM 8
BUN: 8 mg/dL (ref 6–20)
CALCIUM ION: 1.12 mmol/L — AB (ref 1.13–1.30)
CREATININE: 0.8 mg/dL (ref 0.61–1.24)
Chloride: 97 mmol/L — ABNORMAL LOW (ref 101–111)
Glucose, Bld: 98 mg/dL (ref 65–99)
HCT: 49 % (ref 39.0–52.0)
Hemoglobin: 16.7 g/dL (ref 13.0–17.0)
Potassium: 4.1 mmol/L (ref 3.5–5.1)
Sodium: 132 mmol/L — ABNORMAL LOW (ref 135–145)
TCO2: 24 mmol/L (ref 0–100)

## 2015-06-10 SURGERY — MICROLARYNGOSCOPY
Anesthesia: General | Site: Throat | Laterality: Left

## 2015-06-10 MED ORDER — DEXAMETHASONE SODIUM PHOSPHATE 4 MG/ML IJ SOLN
INTRAMUSCULAR | Status: DC | PRN
  Administered 2015-06-10: 10 mg via INTRAVENOUS

## 2015-06-10 MED ORDER — SCOPOLAMINE 1 MG/3DAYS TD PT72: 1.0000 | MEDICATED_PATCH | Freq: Once | TRANSDERMAL | Status: DC | PRN

## 2015-06-10 MED ORDER — MIDAZOLAM HCL 2 MG/2ML IJ SOLN
1.0000 mg | INTRAMUSCULAR | Status: DC | PRN
  Administered 2015-06-10: 2 mg via INTRAVENOUS

## 2015-06-10 MED ORDER — EPINEPHRINE HCL 1 MG/ML IJ SOLN
INTRAMUSCULAR | Status: DC | PRN
  Administered 2015-06-10: 1 mg

## 2015-06-10 MED ORDER — HYDROMORPHONE HCL 1 MG/ML IJ SOLN: 0.2500 mg | INTRAMUSCULAR | Status: DC | PRN

## 2015-06-10 MED ORDER — MEPERIDINE HCL 25 MG/ML IJ SOLN: 6.2500 mg | INTRAMUSCULAR | Status: DC | PRN

## 2015-06-10 MED ORDER — LACTATED RINGERS IV SOLN
INTRAVENOUS | Status: DC
  Administered 2015-06-10: 09:00:00 via INTRAVENOUS

## 2015-06-10 MED ORDER — LIDOCAINE HCL (CARDIAC) 20 MG/ML IV SOLN
INTRAVENOUS | Status: DC | PRN
  Administered 2015-06-10: 60 mg via INTRAVENOUS

## 2015-06-10 MED ORDER — ONDANSETRON HCL 4 MG/2ML IJ SOLN
INTRAMUSCULAR | Status: DC | PRN
  Administered 2015-06-10: 4 mg via INTRAVENOUS

## 2015-06-10 MED ORDER — PROPOFOL 10 MG/ML IV BOLUS
INTRAVENOUS | Status: DC | PRN
  Administered 2015-06-10: 200 mg via INTRAVENOUS

## 2015-06-10 MED ORDER — PROPOFOL INFUSION 10 MG/ML OPTIME
INTRAVENOUS | Status: DC | PRN
  Administered 2015-06-10: 100 ug/kg/min via INTRAVENOUS

## 2015-06-10 MED ORDER — FENTANYL CITRATE (PF) 100 MCG/2ML IJ SOLN
50.0000 ug | INTRAMUSCULAR | Status: DC | PRN
  Administered 2015-06-10: 100 ug via INTRAVENOUS

## 2015-06-10 MED ORDER — PROPOFOL 10 MG/ML IV BOLUS
INTRAVENOUS | Status: AC
  Filled 2015-06-10: qty 20

## 2015-06-10 MED ORDER — MIDAZOLAM HCL 2 MG/2ML IJ SOLN
INTRAMUSCULAR | Status: AC
  Filled 2015-06-10: qty 4

## 2015-06-10 MED ORDER — GLYCOPYRROLATE 0.2 MG/ML IJ SOLN: 0.2000 mg | Freq: Once | INTRAMUSCULAR | Status: DC | PRN

## 2015-06-10 MED ORDER — OXYCODONE HCL 5 MG PO TABS: 5.0000 mg | ORAL_TABLET | Freq: Once | ORAL | Status: DC | PRN

## 2015-06-10 MED ORDER — OXYCODONE HCL 5 MG/5ML PO SOLN: 5.0000 mg | Freq: Once | ORAL | Status: DC | PRN

## 2015-06-10 MED ORDER — FENTANYL CITRATE (PF) 100 MCG/2ML IJ SOLN
INTRAMUSCULAR | Status: AC
  Filled 2015-06-10: qty 4

## 2015-06-10 SURGICAL SUPPLY — 20 items
CANISTER SUCT 1200ML W/VALVE (MISCELLANEOUS) ×3 IMPLANT
GLOVE BIO SURGEON STRL SZ7.5 (GLOVE) ×3 IMPLANT
GLOVE SURG SS PI 7.0 STRL IVOR (GLOVE) ×3 IMPLANT
GOWN STRL REUS W/ TWL LRG LVL3 (GOWN DISPOSABLE) ×1 IMPLANT
GOWN STRL REUS W/TWL LRG LVL3 (GOWN DISPOSABLE) ×2
GUARD TEETH (MISCELLANEOUS) ×3 IMPLANT
MARKER SKIN DUAL TIP RULER LAB (MISCELLANEOUS) IMPLANT
NEEDLE HYPO 18GX1.5 BLUNT FILL (NEEDLE) ×3 IMPLANT
NEEDLE SPNL 22GX7 QUINCKE BK (NEEDLE) IMPLANT
NS IRRIG 1000ML POUR BTL (IV SOLUTION) ×3 IMPLANT
PATTIES SURGICAL .5 X3 (DISPOSABLE) ×3 IMPLANT
SHEET MEDIUM DRAPE 40X70 STRL (DRAPES) ×3 IMPLANT
SLEEVE SCD COMPRESS KNEE MED (MISCELLANEOUS) ×3 IMPLANT
SOLUTION BUTLER CLEAR DIP (MISCELLANEOUS) ×3 IMPLANT
SPONGE GAUZE 4X4 12PLY STER LF (GAUZE/BANDAGES/DRESSINGS) ×3 IMPLANT
SYR CONTROL 10ML LL (SYRINGE) IMPLANT
SYR TB 1ML LL NO SAFETY (SYRINGE) ×3 IMPLANT
TOWEL OR 17X24 6PK STRL BLUE (TOWEL DISPOSABLE) ×3 IMPLANT
TUBE CONNECTING 20'X1/4 (TUBING) ×1
TUBE CONNECTING 20X1/4 (TUBING) ×2 IMPLANT

## 2015-06-10 NOTE — Discharge Instructions (Addendum)
The patient may resume all his previous activities and diet. The patient is instructed to rest his voice as much as possible. He will follow-up in my office in one week.    Post Anesthesia Home Care Instructions  Activity: Get plenty of rest for the remainder of the day. A responsible adult should stay with you for 24 hours following the procedure.  For the next 24 hours, DO NOT: -Drive a car -Paediatric nurse -Drink alcoholic beverages -Take any medication unless instructed by your physician -Make any legal decisions or sign important papers.  Meals: Start with liquid foods such as gelatin or soup. Progress to regular foods as tolerated. Avoid greasy, spicy, heavy foods. If nausea and/or vomiting occur, drink only clear liquids until the nausea and/or vomiting subsides. Call your physician if vomiting continues.  Special Instructions/Symptoms: Your throat may feel dry or sore from the anesthesia or the breathing tube placed in your throat during surgery. If this causes discomfort, gargle with warm salt water. The discomfort should disappear within 24 hours.  If you had a scopolamine patch placed behind your ear for the management of post- operative nausea and/or vomiting:  1. The medication in the patch is effective for 72 hours, after which it should be removed.  Wrap patch in a tissue and discard in the trash. Wash hands thoroughly with soap and water. 2. You may remove the patch earlier than 72 hours if you experience unpleasant side effects which may include dry mouth, dizziness or visual disturbances. 3. Avoid touching the patch. Wash your hands with soap and water after contact with the patch.

## 2015-06-10 NOTE — Op Note (Signed)
DATE OF PROCEDURE:  06/10/2015                              OPERATIVE REPORT  SURGEON:  Leta Baptist, MD  PREOPERATIVE DIAGNOSES: 1. Hoarseness 2. Left vocal cord lesion/leukoplakia  POSTOPERATIVE DIAGNOSES: 1. Hoarseness 2. Left vocal cord lesion/leukoplakia  PROCEDURE PERFORMED:  MicroDirect laryngoscopy with biopsy of the left vocal cord  ANESTHESIA:  General endotracheal tube anesthesia.  COMPLICATIONS:  None.  ESTIMATED BLOOD LOSS:  Minimal.  INDICATION FOR PROCEDURE:  Kevin Durham is a 69 y.o. male with a history of chronic hoarseness. The patient has a 90-pack-year history of tobacco use. 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 Based on the above findings, the decision was made for patient to undergo operative biopsy of his left vocal cord. The risks, benefits, alternatives, and details of the procedure were discussed with the mother.  Questions were invited and answered.  Informed consent was obtained.  DESCRIPTION:  The patient was taken to the operating room and placed supine on the operating table.  General endotracheal tube anesthesia was administered by the anesthesiologist.  The patient was positioned and prepped and draped in a standard fashion for direct laryngoscopy. A Dedo laryngoscope was inserted via the oral cavity into the oral pharynx. Examination of the vallecula, epiglottis, aryepiglottic folds, and piriform sinuses were all normal. The patient's right vocal cord was noted to be normal. However, diffuse leukoplakia and mucosal irregularity of the left vocal cord was noted.  The Dedo laryngoscope was suspended with the Lewy suspender. Under the operating microscope, multiple biopsy specimens were obtained from the left vocal cord. Hemostasis was achieved with pledgets soaked with epinephrine. The Dedo laryngoscope was withdrawn.  The care of the patient was turned over to the  anesthesiologist.  The patient was awakened from anesthesia without difficulty.  He was extubated and transferred to the recovery room in good condition.  OPERATIVE FINDINGS:  Diffuse leukoplakia and mucosal irregularity of the left vocal cord.  SPECIMEN:  Left vocal cord biopsy specimens.  FOLLOWUP CARE:  The patient will be discharged home once awake and alert.  He will follow up in my office in approximately 1 week.  Kevin Durham 06/10/2015 11:52 AM

## 2015-06-10 NOTE — Anesthesia Postprocedure Evaluation (Signed)
  Anesthesia Post-op Note  Patient: Kevin Durham  Procedure(s) Performed: Procedure(s): MICRODIRECT LARYNGOSCOPY WITH BIOPSY OF THE LEFT VOCAL CORD (Left)  Patient Location: PACU  Anesthesia Type: General   Level of Consciousness: awake, alert  and oriented  Airway and Oxygen Therapy: Patient Spontanous Breathing  Post-op Pain: none  Post-op Assessment: Post-op Vital signs reviewed  Post-op Vital Signs: Reviewed  Last Vitals:  Filed Vitals:   06/10/15 1236  BP: 132/78  Pulse: 88  Temp: 36.7 C  Resp: 16    Complications: No apparent anesthesia complications

## 2015-06-10 NOTE — Anesthesia Procedure Notes (Signed)
Procedure Name: Intubation Date/Time: 06/10/2015 11:28 AM Performed by: Maryella Shivers Pre-anesthesia Checklist: Patient identified, Emergency Drugs available, Suction available and Patient being monitored Patient Re-evaluated:Patient Re-evaluated prior to inductionOxygen Delivery Method: Circle System Utilized Preoxygenation: Pre-oxygenation with 100% oxygen Intubation Type: IV induction Ventilation: Mask ventilation without difficulty Laryngoscope Size: Mac and 4 Grade View: Grade II Tube type: Oral Tube size: 6.0 mm Number of attempts: 1 Airway Equipment and Method: Stylet and Oral airway Placement Confirmation: ETT inserted through vocal cords under direct vision,  positive ETCO2 and breath sounds checked- equal and bilateral Secured at: 24 cm Tube secured with: Tape Dental Injury: Teeth and Oropharynx as per pre-operative assessment

## 2015-06-10 NOTE — H&P (Signed)
Cc: Hoarseness  HPI: The patient is a 69 y/o male who presents today for evaluation of hoarseness. The patient is seen in consultation requested by Dr. [name]. The patient has noted persistent hoarseness for the past 6 weeks. He denies any recent URI but notes some post nasal drainage. He also complains of frequent coughing episodes. He denies any dysphagia or odynophagia. No history of reflux is noted. The patient has noted some left sided throat discomfort. The patient is a 40+ pack year smoker. He currently smoke 2ppd. He denies any unexplained weight loss. Previous ENT surgery is denied.  The patient's review of systems (constitutional, eyes, ENT, cardiovascular, respiratory, GI, musculoskeletal, skin, neurologic, psychiatric, endocrine, hematologic, allergic) is noted in the ROS questionnaire.  It is reviewed with the patient.   Family health history: Diabetes.   Major events: Left knee surgery, cervical spine surgery X2.   Ongoing medical problems: Cataracts, hypertension.   Social history: The patient is single. He smokes two pack of cigarettes a day. He drinks alcohol on occasions. he denies the use of illegal drugs.  Exam General: Communicates without difficulty, well nourished, no acute distress. Head: Normocephalic, no evidence injury, no tenderness, facial buttresses intact without stepoff. Eyes: PERRL, EOMI.  No scleral icterus, conjunctivae clear. Ears: External auditory canals clear bilaterally.  There is no edema or erythema.  Tympanic membrane is within normal limits bilaterally. Nose: Normal skin and external support.  Anterior rhinoscopy reveals healthy pink mucosa over the septum and turbinates.  No lesions or polyps were seen. Oral cavity: Lips without lesions, oral mucosa moist, no masses or lesions seen. Indirect  mirror laryngoscopy could not be tolerated. Pharynx: Clear, no erythema. Neck: Supple, full range of motion, no lymphadenopathy, no masses palpable. Salivary:  Parotid and submandibular glands without mass. Neuro:  CN 2-12 grossly intact. Gait normal. Vestibular: No nystagmus at any point of gaze.   Procedure:  Flexible Fiberoptic Laryngoscopy -- Risks, benefits, and alternatives of flexible endoscopy were explained to the patient.  Specific mention was made of the risk of throat numbness with difficulty swallowing, possible bleeding from the nose and mouth, and pain from the procedure.  The patient gave oral consent to proceed.  The nasal cavities were decongested and anesthetised with a combination of oxymetazoline and 4% lidocaine solution.  The flexible scope was inserted into the right nasal cavity and advanced towards the nasopharynx.  Visualized mucosa over the turbinates and septum were as described above.  The nasopharynx was clear.  Oropharyngeal walls were symmetric and mobile without lesion, mass, or edema.  Hypopharynx was also without  lesion or edema.  Larynx was mobile without lesions. Supraglottic structures were free of edema, mass, and asymmetry.  True vocal folds were white with irregularity noted on the left. Base of tongue was within normal limits.  The patient tolerated the procedure well.   Assessment 1. The patient is noted to have irregularity along his left vocal cord with areas of leukoplakia.  No other suspicious mass or lesion is noted on today's fiberoptic laryngoscopy exam.  Plan  1. Laryngoscopy findings are reviewed with the patient.  2. Micro-direct laryngoscopy with biopsy of the left vocal cord. The risks, benefits, alternatives, and details of the procedure are reviewed with the patient. Questions are invited and answered. 3. The patient is placed on Atrovent 2 sprays each nostril bid prn drainage. He is also started on omeprazole 20 mg daily. Tobacco cessation is encouraged.

## 2015-06-10 NOTE — Transfer of Care (Signed)
Immediate Anesthesia Transfer of Care Note  Patient: Kevin Durham  Procedure(s) Performed: Procedure(s): MICRODIRECT LARYNGOSCOPY WITH BIOPSY OF THE LEFT VOCAL CORD (Left)  Patient Location: PACU  Anesthesia Type:General  Level of Consciousness: sedated  Airway & Oxygen Therapy: Patient Spontanous Breathing and Patient connected to face mask oxygen  Post-op Assessment: Report given to RN and Post -op Vital signs reviewed and stable  Post vital signs: Reviewed and stable  Last Vitals:  Filed Vitals:   06/10/15 0845  BP: 143/82  Pulse: 91  Temp: 36.6 C  Resp: 18    Complications: No apparent anesthesia complications

## 2015-06-10 NOTE — Anesthesia Preprocedure Evaluation (Signed)
Anesthesia Evaluation  Patient identified by MRN, date of birth, ID band Patient awake    Reviewed: Allergy & Precautions, NPO status , Patient's Chart, lab work & pertinent test results  Airway Mallampati: I  TM Distance: >3 FB Neck ROM: Full    Dental  (+) Teeth Intact, Dental Advisory Given   Pulmonary Current Smoker,    breath sounds clear to auscultation       Cardiovascular hypertension, Pt. on medications  Rhythm:Regular Rate:Normal     Neuro/Psych    GI/Hepatic GERD  Medicated and Controlled,  Endo/Other    Renal/GU      Musculoskeletal   Abdominal   Peds  Hematology   Anesthesia Other Findings   Reproductive/Obstetrics                             Anesthesia Physical Anesthesia Plan  ASA: II  Anesthesia Plan: General   Post-op Pain Management:    Induction: Intravenous  Airway Management Planned: Oral ETT  Additional Equipment:   Intra-op Plan:   Post-operative Plan: Extubation in OR  Informed Consent: I have reviewed the patients History and Physical, chart, labs and discussed the procedure including the risks, benefits and alternatives for the proposed anesthesia with the patient or authorized representative who has indicated his/her understanding and acceptance.   Dental advisory given  Plan Discussed with: CRNA, Anesthesiologist and Surgeon  Anesthesia Plan Comments:         Anesthesia Quick Evaluation

## 2015-06-11 ENCOUNTER — Encounter (HOSPITAL_BASED_OUTPATIENT_CLINIC_OR_DEPARTMENT_OTHER): Payer: Self-pay | Admitting: Otolaryngology

## 2015-06-17 ENCOUNTER — Ambulatory Visit (HOSPITAL_COMMUNITY): Payer: Medicare Other | Admitting: Hematology & Oncology

## 2015-06-17 DIAGNOSIS — C329 Malignant neoplasm of larynx, unspecified: Secondary | ICD-10-CM | POA: Diagnosis not present

## 2015-06-20 ENCOUNTER — Encounter (HOSPITAL_COMMUNITY): Payer: Self-pay | Admitting: Hematology & Oncology

## 2015-06-20 ENCOUNTER — Encounter (HOSPITAL_COMMUNITY): Payer: Self-pay | Admitting: Lab

## 2015-06-20 ENCOUNTER — Encounter (HOSPITAL_COMMUNITY): Payer: Medicare Other | Attending: Hematology & Oncology | Admitting: Hematology & Oncology

## 2015-06-20 VITALS — BP 163/94 | HR 49 | Temp 98.1°F | Resp 18 | Ht 75.0 in | Wt 179.6 lb

## 2015-06-20 DIAGNOSIS — C320 Malignant neoplasm of glottis: Secondary | ICD-10-CM

## 2015-06-20 DIAGNOSIS — Z720 Tobacco use: Secondary | ICD-10-CM | POA: Diagnosis not present
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
## 2015-06-20 NOTE — Patient Instructions (Addendum)
[place] at [facility] Discharge Instructions  RECOMMENDATIONS MADE BY THE CONSULTANT AND ANY TEST RESULTS WILL BE SENT TO YOUR REFERRING PHYSICIAN.  Exam completed by Dr Whitney Muse today Refer to Dr Isidore Moos in [place] for cancer of true vocal cords CT scan orders scheduled Return to see the doctor in 1 month Please call the clinic if you have any questions or concerns  Thank you for choosing [place] at [facility] to provide your oncology and hematology care.  To afford each patient quality time with our provider, please arrive at least 15 minutes before your scheduled appointment time.    You need to re-schedule your appointment should you arrive 10 or more minutes late.  We strive to give you quality time with our providers, and arriving late affects you and other patients whose appointments are after yours.  Also, if you no show three or more times for appointments you may be dismissed from the clinic at the providers discretion.     Again, thank you for choosing [facility].  Our hope is that these requests will decrease the amount of time that you wait before being seen by our physicians.       _____________________________________________________________  Should you have questions after your visit to [facility], please contact our office at [phone] between the hours of 8:30 a.m. and 4:30 p.m.  Voicemails left after 4:30 p.m. will not be returned until the following business day.  For prescription refill requests, have your pharmacy contact our office.

## 2015-06-20 NOTE — Progress Notes (Signed)
Referral sent to [facility].  Records faxed on 9/30.  They will call patient to schedule.

## 2015-06-26 ENCOUNTER — Ambulatory Visit (HOSPITAL_COMMUNITY)
Admission: RE | Admit: 2015-06-26 | Discharge: 2015-06-26 | Disposition: A | Discharge status: Home / Self Care | Payer: Medicare Other | Source: Ambulatory Visit | Attending: Hematology & Oncology | Admitting: Hematology & Oncology

## 2015-06-26 DIAGNOSIS — C320 Malignant neoplasm of glottis: Secondary | ICD-10-CM | POA: Diagnosis not present

## 2015-06-26 MED ORDER — IOHEXOL 300 MG/ML  SOLN
100.0000 mL | Freq: Once | INTRAMUSCULAR | Status: AC | PRN
  Administered 2015-06-26: 100 mL via INTRAVENOUS

## 2015-07-08 DIAGNOSIS — F17200 Nicotine dependence, unspecified, uncomplicated: Secondary | ICD-10-CM | POA: Diagnosis not present

## 2015-07-08 DIAGNOSIS — Z7982 Long term (current) use of aspirin: Secondary | ICD-10-CM | POA: Diagnosis not present

## 2015-07-08 DIAGNOSIS — Z791 Long term (current) use of non-steroidal anti-inflammatories (NSAID): Secondary | ICD-10-CM | POA: Diagnosis not present

## 2015-07-08 DIAGNOSIS — C320 Malignant neoplasm of glottis: Secondary | ICD-10-CM | POA: Diagnosis not present

## 2015-07-08 DIAGNOSIS — I10 Essential (primary) hypertension: Secondary | ICD-10-CM | POA: Diagnosis not present

## 2015-07-08 DIAGNOSIS — Z510 Encounter for antineoplastic radiation therapy: Secondary | ICD-10-CM | POA: Diagnosis not present

## 2015-07-08 DIAGNOSIS — Z79899 Other long term (current) drug therapy: Secondary | ICD-10-CM | POA: Diagnosis not present

## 2015-07-11 DIAGNOSIS — Z79899 Other long term (current) drug therapy: Secondary | ICD-10-CM | POA: Diagnosis not present

## 2015-07-11 DIAGNOSIS — Z510 Encounter for antineoplastic radiation therapy: Secondary | ICD-10-CM | POA: Diagnosis not present

## 2015-07-11 DIAGNOSIS — F17200 Nicotine dependence, unspecified, uncomplicated: Secondary | ICD-10-CM | POA: Diagnosis not present

## 2015-07-11 DIAGNOSIS — C320 Malignant neoplasm of glottis: Secondary | ICD-10-CM | POA: Diagnosis not present

## 2015-07-11 DIAGNOSIS — Z791 Long term (current) use of non-steroidal anti-inflammatories (NSAID): Secondary | ICD-10-CM | POA: Diagnosis not present

## 2015-07-11 DIAGNOSIS — Z7982 Long term (current) use of aspirin: Secondary | ICD-10-CM | POA: Diagnosis not present

## 2015-07-11 DIAGNOSIS — I10 Essential (primary) hypertension: Secondary | ICD-10-CM | POA: Diagnosis not present

## 2015-07-14 DIAGNOSIS — C320 Malignant neoplasm of glottis: Secondary | ICD-10-CM | POA: Diagnosis not present

## 2015-07-14 DIAGNOSIS — Z79899 Other long term (current) drug therapy: Secondary | ICD-10-CM | POA: Diagnosis not present

## 2015-07-14 DIAGNOSIS — I10 Essential (primary) hypertension: Secondary | ICD-10-CM | POA: Diagnosis not present

## 2015-07-14 DIAGNOSIS — Z791 Long term (current) use of non-steroidal anti-inflammatories (NSAID): Secondary | ICD-10-CM | POA: Diagnosis not present

## 2015-07-14 DIAGNOSIS — F17200 Nicotine dependence, unspecified, uncomplicated: Secondary | ICD-10-CM | POA: Diagnosis not present

## 2015-07-14 DIAGNOSIS — Z510 Encounter for antineoplastic radiation therapy: Secondary | ICD-10-CM | POA: Diagnosis not present

## 2015-07-14 DIAGNOSIS — Z7982 Long term (current) use of aspirin: Secondary | ICD-10-CM | POA: Diagnosis not present

## 2015-07-15 DIAGNOSIS — F17200 Nicotine dependence, unspecified, uncomplicated: Secondary | ICD-10-CM | POA: Diagnosis not present

## 2015-07-15 DIAGNOSIS — I10 Essential (primary) hypertension: Secondary | ICD-10-CM | POA: Diagnosis not present

## 2015-07-15 DIAGNOSIS — Z510 Encounter for antineoplastic radiation therapy: Secondary | ICD-10-CM | POA: Diagnosis not present

## 2015-07-15 DIAGNOSIS — Z791 Long term (current) use of non-steroidal anti-inflammatories (NSAID): Secondary | ICD-10-CM | POA: Diagnosis not present

## 2015-07-15 DIAGNOSIS — Z79899 Other long term (current) drug therapy: Secondary | ICD-10-CM | POA: Diagnosis not present

## 2015-07-15 DIAGNOSIS — C320 Malignant neoplasm of glottis: Secondary | ICD-10-CM | POA: Diagnosis not present

## 2015-07-15 DIAGNOSIS — Z7982 Long term (current) use of aspirin: Secondary | ICD-10-CM | POA: Diagnosis not present

## 2015-07-16 DIAGNOSIS — Z791 Long term (current) use of non-steroidal anti-inflammatories (NSAID): Secondary | ICD-10-CM | POA: Diagnosis not present

## 2015-07-16 DIAGNOSIS — I10 Essential (primary) hypertension: Secondary | ICD-10-CM | POA: Diagnosis not present

## 2015-07-16 DIAGNOSIS — C320 Malignant neoplasm of glottis: Secondary | ICD-10-CM | POA: Diagnosis not present

## 2015-07-16 DIAGNOSIS — Z79899 Other long term (current) drug therapy: Secondary | ICD-10-CM | POA: Diagnosis not present

## 2015-07-16 DIAGNOSIS — Z510 Encounter for antineoplastic radiation therapy: Secondary | ICD-10-CM | POA: Diagnosis not present

## 2015-07-16 DIAGNOSIS — Z7982 Long term (current) use of aspirin: Secondary | ICD-10-CM | POA: Diagnosis not present

## 2015-07-16 DIAGNOSIS — F17200 Nicotine dependence, unspecified, uncomplicated: Secondary | ICD-10-CM | POA: Diagnosis not present

## 2015-07-17 DIAGNOSIS — Z510 Encounter for antineoplastic radiation therapy: Secondary | ICD-10-CM | POA: Diagnosis not present

## 2015-07-17 DIAGNOSIS — F17200 Nicotine dependence, unspecified, uncomplicated: Secondary | ICD-10-CM | POA: Diagnosis not present

## 2015-07-17 DIAGNOSIS — Z7982 Long term (current) use of aspirin: Secondary | ICD-10-CM | POA: Diagnosis not present

## 2015-07-17 DIAGNOSIS — Z791 Long term (current) use of non-steroidal anti-inflammatories (NSAID): Secondary | ICD-10-CM | POA: Diagnosis not present

## 2015-07-17 DIAGNOSIS — I10 Essential (primary) hypertension: Secondary | ICD-10-CM | POA: Diagnosis not present

## 2015-07-17 DIAGNOSIS — Z79899 Other long term (current) drug therapy: Secondary | ICD-10-CM | POA: Diagnosis not present

## 2015-07-17 DIAGNOSIS — C320 Malignant neoplasm of glottis: Secondary | ICD-10-CM | POA: Diagnosis not present

## 2015-07-18 DIAGNOSIS — C320 Malignant neoplasm of glottis: Secondary | ICD-10-CM | POA: Diagnosis not present

## 2015-07-18 DIAGNOSIS — I10 Essential (primary) hypertension: Secondary | ICD-10-CM | POA: Diagnosis not present

## 2015-07-18 DIAGNOSIS — F17200 Nicotine dependence, unspecified, uncomplicated: Secondary | ICD-10-CM | POA: Diagnosis not present

## 2015-07-18 DIAGNOSIS — Z791 Long term (current) use of non-steroidal anti-inflammatories (NSAID): Secondary | ICD-10-CM | POA: Diagnosis not present

## 2015-07-18 DIAGNOSIS — Z79899 Other long term (current) drug therapy: Secondary | ICD-10-CM | POA: Diagnosis not present

## 2015-07-18 DIAGNOSIS — Z7982 Long term (current) use of aspirin: Secondary | ICD-10-CM | POA: Diagnosis not present

## 2015-07-18 DIAGNOSIS — Z510 Encounter for antineoplastic radiation therapy: Secondary | ICD-10-CM | POA: Diagnosis not present

## 2015-07-21 DIAGNOSIS — Z79899 Other long term (current) drug therapy: Secondary | ICD-10-CM | POA: Diagnosis not present

## 2015-07-21 DIAGNOSIS — I10 Essential (primary) hypertension: Secondary | ICD-10-CM | POA: Diagnosis not present

## 2015-07-21 DIAGNOSIS — F419 Anxiety disorder, unspecified: Secondary | ICD-10-CM | POA: Diagnosis not present

## 2015-07-22 DIAGNOSIS — C320 Malignant neoplasm of glottis: Secondary | ICD-10-CM | POA: Diagnosis not present

## 2015-07-22 DIAGNOSIS — Z510 Encounter for antineoplastic radiation therapy: Secondary | ICD-10-CM | POA: Diagnosis not present

## 2015-07-23 DIAGNOSIS — C320 Malignant neoplasm of glottis: Secondary | ICD-10-CM | POA: Diagnosis not present

## 2015-07-23 DIAGNOSIS — Z510 Encounter for antineoplastic radiation therapy: Secondary | ICD-10-CM | POA: Diagnosis not present

## 2015-07-24 DIAGNOSIS — Z510 Encounter for antineoplastic radiation therapy: Secondary | ICD-10-CM | POA: Diagnosis not present

## 2015-07-24 DIAGNOSIS — C320 Malignant neoplasm of glottis: Secondary | ICD-10-CM | POA: Diagnosis not present

## 2015-07-25 ENCOUNTER — Ambulatory Visit (HOSPITAL_COMMUNITY): Payer: Medicare Other | Admitting: Hematology & Oncology

## 2015-07-25 DIAGNOSIS — Z510 Encounter for antineoplastic radiation therapy: Secondary | ICD-10-CM | POA: Diagnosis not present

## 2015-07-25 DIAGNOSIS — C320 Malignant neoplasm of glottis: Secondary | ICD-10-CM | POA: Diagnosis not present

## 2015-07-28 DIAGNOSIS — Z510 Encounter for antineoplastic radiation therapy: Secondary | ICD-10-CM | POA: Diagnosis not present

## 2015-07-28 DIAGNOSIS — C320 Malignant neoplasm of glottis: Secondary | ICD-10-CM | POA: Diagnosis not present

## 2015-07-29 DIAGNOSIS — Z510 Encounter for antineoplastic radiation therapy: Secondary | ICD-10-CM | POA: Diagnosis not present

## 2015-07-29 DIAGNOSIS — C320 Malignant neoplasm of glottis: Secondary | ICD-10-CM | POA: Diagnosis not present

## 2015-07-30 DIAGNOSIS — Z510 Encounter for antineoplastic radiation therapy: Secondary | ICD-10-CM | POA: Diagnosis not present

## 2015-07-30 DIAGNOSIS — C320 Malignant neoplasm of glottis: Secondary | ICD-10-CM | POA: Diagnosis not present

## 2015-07-31 DIAGNOSIS — C320 Malignant neoplasm of glottis: Secondary | ICD-10-CM | POA: Diagnosis not present

## 2015-07-31 DIAGNOSIS — Z510 Encounter for antineoplastic radiation therapy: Secondary | ICD-10-CM | POA: Diagnosis not present

## 2015-08-01 DIAGNOSIS — C320 Malignant neoplasm of glottis: Secondary | ICD-10-CM | POA: Diagnosis not present

## 2015-08-01 DIAGNOSIS — D020 Carcinoma in situ of larynx: Secondary | ICD-10-CM | POA: Diagnosis not present

## 2015-08-01 DIAGNOSIS — I10 Essential (primary) hypertension: Secondary | ICD-10-CM | POA: Diagnosis not present

## 2015-08-01 DIAGNOSIS — Z23 Encounter for immunization: Secondary | ICD-10-CM | POA: Diagnosis not present

## 2015-08-01 DIAGNOSIS — Z510 Encounter for antineoplastic radiation therapy: Secondary | ICD-10-CM | POA: Diagnosis not present

## 2015-08-01 DIAGNOSIS — Z6822 Body mass index (BMI) 22.0-22.9, adult: Secondary | ICD-10-CM | POA: Diagnosis not present

## 2015-08-04 DIAGNOSIS — C320 Malignant neoplasm of glottis: Secondary | ICD-10-CM | POA: Diagnosis not present

## 2015-08-04 DIAGNOSIS — Z510 Encounter for antineoplastic radiation therapy: Secondary | ICD-10-CM | POA: Diagnosis not present

## 2015-08-05 DIAGNOSIS — Z510 Encounter for antineoplastic radiation therapy: Secondary | ICD-10-CM | POA: Diagnosis not present

## 2015-08-05 DIAGNOSIS — C320 Malignant neoplasm of glottis: Secondary | ICD-10-CM | POA: Diagnosis not present

## 2015-08-06 DIAGNOSIS — Z510 Encounter for antineoplastic radiation therapy: Secondary | ICD-10-CM | POA: Diagnosis not present

## 2015-08-06 DIAGNOSIS — C320 Malignant neoplasm of glottis: Secondary | ICD-10-CM | POA: Diagnosis not present

## 2015-08-07 DIAGNOSIS — Z510 Encounter for antineoplastic radiation therapy: Secondary | ICD-10-CM | POA: Diagnosis not present

## 2015-08-07 DIAGNOSIS — C320 Malignant neoplasm of glottis: Secondary | ICD-10-CM | POA: Diagnosis not present

## 2015-08-08 DIAGNOSIS — C320 Malignant neoplasm of glottis: Secondary | ICD-10-CM | POA: Diagnosis not present

## 2015-08-08 DIAGNOSIS — Z510 Encounter for antineoplastic radiation therapy: Secondary | ICD-10-CM | POA: Diagnosis not present

## 2015-08-11 DIAGNOSIS — C320 Malignant neoplasm of glottis: Secondary | ICD-10-CM | POA: Diagnosis not present

## 2015-08-11 DIAGNOSIS — Z510 Encounter for antineoplastic radiation therapy: Secondary | ICD-10-CM | POA: Diagnosis not present

## 2015-08-12 DIAGNOSIS — Z510 Encounter for antineoplastic radiation therapy: Secondary | ICD-10-CM | POA: Diagnosis not present

## 2015-08-12 DIAGNOSIS — C320 Malignant neoplasm of glottis: Secondary | ICD-10-CM | POA: Diagnosis not present

## 2015-08-13 ENCOUNTER — Encounter (HOSPITAL_COMMUNITY): Payer: Medicare Other | Attending: Hematology & Oncology | Admitting: Hematology & Oncology

## 2015-08-13 ENCOUNTER — Encounter (HOSPITAL_COMMUNITY): Payer: Self-pay | Admitting: Hematology & Oncology

## 2015-08-13 VITALS — BP 158/82 | HR 93 | Temp 97.8°F | Resp 16 | Wt 175.2 lb

## 2015-08-13 DIAGNOSIS — C320 Malignant neoplasm of glottis: Secondary | ICD-10-CM | POA: Diagnosis not present

## 2015-08-13 DIAGNOSIS — Z720 Tobacco use: Secondary | ICD-10-CM

## 2015-08-13 DIAGNOSIS — Z510 Encounter for antineoplastic radiation therapy: Secondary | ICD-10-CM | POA: Diagnosis not present
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
## 2015-08-13 NOTE — Patient Instructions (Addendum)
[place] at [facility] Discharge Instructions  RECOMMENDATIONS MADE BY THE CONSULTANT AND ANY TEST RESULTS WILL BE SENT TO YOUR REFERRING PHYSICIAN.   Exam completed by Dr Whitney Muse today You have one more week of radiation It will take you a few weeks to heal after you completely stop radiation.  Return to see the doctor in 2 months  Please call the clinic if you have any questions or concerns    Thank you for choosing [place] at [facility] to provide your oncology and hematology care.  To afford each patient quality time with our provider, please arrive at least 15 minutes before your scheduled appointment time.    You need to re-schedule your appointment should you arrive 10 or more minutes late.  We strive to give you quality time with our providers, and arriving late affects you and other patients whose appointments are after yours.  Also, if you no show three or more times for appointments you may be dismissed from the clinic at the providers discretion.     Again, thank you for choosing [facility].  Our hope is that these requests will decrease the amount of time that you wait before being seen by our physicians.       _____________________________________________________________  Should you have questions after your visit to [facility], please contact our office at [phone] between the hours of 8:30 a.m. and 4:30 p.m.  Voicemails left after 4:30 p.m. will not be returned until the following business day.  For prescription refill requests, have your pharmacy contact our office.

## 2015-08-18 DIAGNOSIS — Z510 Encounter for antineoplastic radiation therapy: Secondary | ICD-10-CM | POA: Diagnosis not present

## 2015-08-18 DIAGNOSIS — C320 Malignant neoplasm of glottis: Secondary | ICD-10-CM | POA: Diagnosis not present

## 2015-08-19 DIAGNOSIS — Z510 Encounter for antineoplastic radiation therapy: Secondary | ICD-10-CM | POA: Diagnosis not present

## 2015-08-19 DIAGNOSIS — C320 Malignant neoplasm of glottis: Secondary | ICD-10-CM | POA: Diagnosis not present

## 2015-08-20 DIAGNOSIS — C320 Malignant neoplasm of glottis: Secondary | ICD-10-CM | POA: Diagnosis not present

## 2015-08-20 DIAGNOSIS — Z510 Encounter for antineoplastic radiation therapy: Secondary | ICD-10-CM | POA: Diagnosis not present

## 2015-08-21 DIAGNOSIS — Z510 Encounter for antineoplastic radiation therapy: Secondary | ICD-10-CM | POA: Diagnosis not present

## 2015-08-21 DIAGNOSIS — C320 Malignant neoplasm of glottis: Secondary | ICD-10-CM | POA: Diagnosis not present

## 2015-08-21 DIAGNOSIS — F17210 Nicotine dependence, cigarettes, uncomplicated: Secondary | ICD-10-CM | POA: Diagnosis not present

## 2015-08-22 DIAGNOSIS — C320 Malignant neoplasm of glottis: Secondary | ICD-10-CM | POA: Diagnosis not present

## 2015-08-22 DIAGNOSIS — Z510 Encounter for antineoplastic radiation therapy: Secondary | ICD-10-CM | POA: Diagnosis not present

## 2015-08-22 DIAGNOSIS — F17210 Nicotine dependence, cigarettes, uncomplicated: Secondary | ICD-10-CM | POA: Diagnosis not present

## 2015-09-02 DIAGNOSIS — F17210 Nicotine dependence, cigarettes, uncomplicated: Secondary | ICD-10-CM | POA: Diagnosis not present

## 2015-09-02 DIAGNOSIS — Z510 Encounter for antineoplastic radiation therapy: Secondary | ICD-10-CM | POA: Diagnosis not present

## 2015-09-02 DIAGNOSIS — C320 Malignant neoplasm of glottis: Secondary | ICD-10-CM | POA: Diagnosis not present

## 2015-09-25 ENCOUNTER — Ambulatory Visit (INDEPENDENT_AMBULATORY_CARE_PROVIDER_SITE_OTHER): Payer: Medicare Other | Admitting: Otolaryngology

## 2015-09-25 DIAGNOSIS — C320 Malignant neoplasm of glottis: Secondary | ICD-10-CM

## 2015-10-13 ENCOUNTER — Encounter (HOSPITAL_COMMUNITY): Payer: Medicare Other | Attending: Hematology & Oncology | Admitting: Hematology & Oncology

## 2015-10-13 ENCOUNTER — Encounter (HOSPITAL_COMMUNITY): Payer: Self-pay | Admitting: Hematology & Oncology

## 2015-10-13 VITALS — BP 140/59 | HR 54 | Temp 97.9°F | Resp 16 | Wt 174.6 lb

## 2015-10-13 DIAGNOSIS — Z720 Tobacco use: Secondary | ICD-10-CM | POA: Diagnosis not present

## 2015-10-13 DIAGNOSIS — C320 Malignant neoplasm of glottis: Secondary | ICD-10-CM | POA: Diagnosis not present

## 2015-10-13 NOTE — Patient Instructions (Signed)
.  [place] at [facility] Discharge Instructions  RECOMMENDATIONS MADE BY THE CONSULTANT AND ANY TEST RESULTS WILL BE SENT TO YOUR REFERRING PHYSICIAN.    Exam and discussion completed by Dr Whitney Muse today  Just small goals, try to smoke one less   Return to see the doctor in 6 months  Please call the clinic if you have any questions or concerns      Thank you for choosing [place] at [facility] to provide your oncology and hematology care.  To afford each patient quality time with our provider, please arrive at least 15 minutes before your scheduled appointment time.   Beginning January 23rd 2017 lab work for the [facility] will be done in the  Main lab at [facility] on 1st floor. If you have a lab appointment with the [facility] please come in thru the  Main Entrance and check in at the main information desk  You need to re-schedule your appointment should you arrive 10 or more minutes late.  We strive to give you quality time with our providers, and arriving late affects you and other patients whose appointments are after yours.  Also, if you no show three or more times for appointments you may be dismissed from the clinic at the providers discretion.     Again, thank you for choosing [facility].  Our hope is that these requests will decrease the amount of time that you wait before being seen by our physicians.       _____________________________________________________________  Should you have questions after your visit to [facility], please contact our office at [phone] between the hours of 8:30 a.m. and 4:30 p.m.  Voicemails left after 4:30 p.m. will not be returned until the following business day.  For prescription refill requests, have your pharmacy contact our office.
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
## 2015-11-17 DIAGNOSIS — D020 Carcinoma in situ of larynx: Secondary | ICD-10-CM | POA: Diagnosis not present

## 2015-11-17 DIAGNOSIS — Z6821 Body mass index (BMI) 21.0-21.9, adult: Secondary | ICD-10-CM | POA: Diagnosis not present

## 2015-11-17 DIAGNOSIS — I10 Essential (primary) hypertension: Secondary | ICD-10-CM | POA: Diagnosis not present

## 2015-12-16 DIAGNOSIS — Z8521 Personal history of malignant neoplasm of larynx: Secondary | ICD-10-CM | POA: Diagnosis not present

## 2015-12-16 DIAGNOSIS — C320 Malignant neoplasm of glottis: Secondary | ICD-10-CM | POA: Diagnosis not present

## 2015-12-16 DIAGNOSIS — Z08 Encounter for follow-up examination after completed treatment for malignant neoplasm: Secondary | ICD-10-CM | POA: Diagnosis not present

## 2016-03-22 DIAGNOSIS — I493 Ventricular premature depolarization: Secondary | ICD-10-CM | POA: Diagnosis not present

## 2016-03-22 DIAGNOSIS — Z23 Encounter for immunization: Secondary | ICD-10-CM | POA: Diagnosis not present

## 2016-03-22 DIAGNOSIS — I10 Essential (primary) hypertension: Secondary | ICD-10-CM | POA: Diagnosis not present

## 2016-04-01 ENCOUNTER — Ambulatory Visit (INDEPENDENT_AMBULATORY_CARE_PROVIDER_SITE_OTHER): Payer: Medicare Other | Admitting: Otolaryngology

## 2016-04-01 DIAGNOSIS — Z8521 Personal history of malignant neoplasm of larynx: Secondary | ICD-10-CM

## 2016-04-12 ENCOUNTER — Ambulatory Visit (HOSPITAL_COMMUNITY): Payer: Medicare Other | Admitting: Hematology & Oncology

## 2016-05-03 ENCOUNTER — Encounter (HOSPITAL_COMMUNITY): Payer: Medicare Other | Attending: Oncology | Admitting: Oncology

## 2016-05-03 ENCOUNTER — Other Ambulatory Visit (HOSPITAL_COMMUNITY): Payer: Self-pay | Admitting: Oncology

## 2016-05-03 VITALS — BP 152/61 | HR 95 | Temp 97.5°F | Resp 20 | Wt 176.3 lb

## 2016-05-03 DIAGNOSIS — Z720 Tobacco use: Secondary | ICD-10-CM

## 2016-05-03 DIAGNOSIS — C320 Malignant neoplasm of glottis: Secondary | ICD-10-CM | POA: Diagnosis not present
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
## 2016-05-03 NOTE — Patient Instructions (Addendum)
[facility] at [facility] Discharge Instructions  RECOMMENDATIONS MADE BY THE CONSULTANT AND ANY TEST RESULTS WILL BE SENT TO YOUR REFERRING PHYSICIAN.  You can try taking Mucinex (regular Mucinex) for the phlegm in your throat  We recommend a Ct scan of your chest for screening due to your - October/November of this year  Next appointment in 6 months with labs (CBC diff, CMP, TSH, T4)   Thank you for choosing [facility] at [facility] to provide your oncology and hematology care.  To afford each patient quality time with our provider, please arrive at least 15 minutes before your scheduled appointment time.   Beginning January 23rd 2017 lab work for the [facility] will be done in the  Main lab at [facility] on 1st floor. If you have a lab appointment with the [facility] please come in thru the  Main Entrance and check in at the main information desk  You need to re-schedule your appointment should you arrive 10 or more minutes late.  We strive to give you quality time with our providers, and arriving late affects you and other patients whose appointments are after yours.  Also, if you no show three or more times for appointments you may be dismissed from the clinic at the providers discretion.     Again, thank you for choosing [facility].  Our hope is that these requests will decrease the amount of time that you wait before being seen by our physicians.       _____________________________________________________________  Should you have questions after your visit to [facility], please contact our office at [phone] between the hours of 8:30 a.m. and 4:30 p.m.  Voicemails left after 4:30 p.m. will not be returned until the following business day.  For prescription refill requests, have your pharmacy contact our office.         Resources For Cancer Patients and their Caregivers ? American Cancer  Society: Can assist with transportation, wigs, general needs, runs Look Good Feel Better.        570-161-1406 ? Cancer Care: Provides financial assistance, online support groups, medication/co-pay assistance.  1-800-813-HOPE (737) 491-9597) ? [place] Assists [place] cancer patients and their families through emotional , educational and financial support.  906-457-4738 ? [place] DSS Where to apply for food stamps, Medicaid and utility assistance. (678)860-9471 ? [facility]: Transportation to medical appointments. 662-129-1913 ? Social Security Administration: May apply for disability if have a Stage IV cancer. 450-088-1126 916-742-9634 ? [employer], Disability and Transit Services: Assists with nutrition, care and transit needs. [facility] Support Programs: @10RELATIVEDAYS @ > Cancer Support Group  2nd Tuesday of the month 1pm-2pm, Journey Room  > Creative Journey  3rd Tuesday of the month 1130am-1pm, Journey Room  > Look Good Feel Better  1st Wednesday of the month 10am-12 noon, Journey Room (Call [facility] to register [phone])

## 2016-06-15 DIAGNOSIS — Z8521 Personal history of malignant neoplasm of larynx: Secondary | ICD-10-CM | POA: Diagnosis not present

## 2016-06-15 DIAGNOSIS — Z08 Encounter for follow-up examination after completed treatment for malignant neoplasm: Secondary | ICD-10-CM | POA: Diagnosis not present

## 2016-07-07 ENCOUNTER — Ambulatory Visit (HOSPITAL_COMMUNITY)
Admission: RE | Admit: 2016-07-07 | Discharge: 2016-07-07 | Disposition: A | Discharge status: Home / Self Care | Payer: Medicare Other | Source: Ambulatory Visit | Attending: Oncology | Admitting: Oncology

## 2016-07-07 DIAGNOSIS — I2510 Atherosclerotic heart disease of native coronary artery without angina pectoris: Secondary | ICD-10-CM | POA: Insufficient documentation

## 2016-07-07 DIAGNOSIS — C320 Malignant neoplasm of glottis: Secondary | ICD-10-CM | POA: Diagnosis not present

## 2016-07-07 DIAGNOSIS — I700 Atherosclerosis of aorta: Secondary | ICD-10-CM | POA: Diagnosis not present

## 2016-07-07 DIAGNOSIS — Z87891 Personal history of nicotine dependence: Secondary | ICD-10-CM | POA: Diagnosis not present

## 2016-08-02 DIAGNOSIS — I10 Essential (primary) hypertension: Secondary | ICD-10-CM | POA: Diagnosis not present

## 2016-08-02 DIAGNOSIS — I700 Atherosclerosis of aorta: Secondary | ICD-10-CM | POA: Diagnosis not present

## 2016-08-02 DIAGNOSIS — Z23 Encounter for immunization: Secondary | ICD-10-CM | POA: Diagnosis not present

## 2016-08-02 DIAGNOSIS — J449 Chronic obstructive pulmonary disease, unspecified: Secondary | ICD-10-CM | POA: Diagnosis not present

## 2016-10-07 ENCOUNTER — Ambulatory Visit (INDEPENDENT_AMBULATORY_CARE_PROVIDER_SITE_OTHER): Payer: Medicare Other | Admitting: Otolaryngology

## 2016-10-21 ENCOUNTER — Ambulatory Visit (INDEPENDENT_AMBULATORY_CARE_PROVIDER_SITE_OTHER): Payer: Medicare Other | Admitting: Otolaryngology

## 2016-10-21 DIAGNOSIS — F17210 Nicotine dependence, cigarettes, uncomplicated: Secondary | ICD-10-CM

## 2016-10-21 DIAGNOSIS — Z8521 Personal history of malignant neoplasm of larynx: Secondary | ICD-10-CM

## 2016-11-05 ENCOUNTER — Encounter (HOSPITAL_COMMUNITY): Payer: Medicare Other

## 2016-11-05 ENCOUNTER — Encounter (HOSPITAL_COMMUNITY): Payer: Medicare Other | Attending: Oncology | Admitting: Oncology

## 2016-11-05 ENCOUNTER — Encounter (HOSPITAL_COMMUNITY): Payer: Self-pay

## 2016-11-05 VITALS — BP 165/65 | HR 50 | Temp 97.7°F | Resp 18 | Wt 176.7 lb

## 2016-11-05 DIAGNOSIS — Z720 Tobacco use: Secondary | ICD-10-CM

## 2016-11-05 DIAGNOSIS — Z8521 Personal history of malignant neoplasm of larynx: Secondary | ICD-10-CM | POA: Diagnosis not present

## 2016-11-05 DIAGNOSIS — C320 Malignant neoplasm of glottis: Secondary | ICD-10-CM

## 2016-11-05 DIAGNOSIS — Z923 Personal history of irradiation: Secondary | ICD-10-CM | POA: Diagnosis not present

## 2016-11-05 DIAGNOSIS — M549 Dorsalgia, unspecified: Secondary | ICD-10-CM | POA: Insufficient documentation

## 2016-11-05 DIAGNOSIS — K219 Gastro-esophageal reflux disease without esophagitis: Secondary | ICD-10-CM | POA: Insufficient documentation

## 2016-11-05 DIAGNOSIS — I10 Essential (primary) hypertension: Secondary | ICD-10-CM | POA: Insufficient documentation

## 2016-11-05 DIAGNOSIS — F17210 Nicotine dependence, cigarettes, uncomplicated: Secondary | ICD-10-CM | POA: Diagnosis not present

## 2016-11-05 DIAGNOSIS — G8929 Other chronic pain: Secondary | ICD-10-CM | POA: Insufficient documentation

## 2016-11-05 DIAGNOSIS — M542 Cervicalgia: Secondary | ICD-10-CM | POA: Insufficient documentation

## 2016-11-05 DIAGNOSIS — M109 Gout, unspecified: Secondary | ICD-10-CM | POA: Diagnosis not present

## 2016-11-05 LAB — COMPREHENSIVE METABOLIC PANEL
ALBUMIN: 4.1 g/dL (ref 3.5–5.0)
ALK PHOS: 68 U/L (ref 38–126)
ALT: 15 U/L — ABNORMAL LOW (ref 17–63)
ANION GAP: 7 (ref 5–15)
AST: 19 U/L (ref 15–41)
BUN: 10 mg/dL (ref 6–20)
CALCIUM: 8.7 mg/dL — AB (ref 8.9–10.3)
CO2: 26 mmol/L (ref 22–32)
Chloride: 95 mmol/L — ABNORMAL LOW (ref 101–111)
Creatinine, Ser: 0.7 mg/dL (ref 0.61–1.24)
GFR calc non Af Amer: >60 mL/min (ref >60)
GLUCOSE: 92 mg/dL (ref 65–99)
POTASSIUM: 4.2 mmol/L (ref 3.5–5.1)
SODIUM: 128 mmol/L — AB (ref 135–145)
TOTAL PROTEIN: 7.2 g/dL (ref 6.5–8.1)
Total Bilirubin: 0.7 mg/dL (ref 0.3–1.2)

## 2016-11-05 LAB — CBC WITH DIFFERENTIAL/PLATELET
BASOS PCT: 0 %
Basophils Absolute: 0 10*3/uL (ref 0.0–0.1)
EOS ABS: 0.1 10*3/uL (ref 0.0–0.7)
EOS PCT: 1 %
HCT: 40.8 % (ref 39.0–52.0)
Hemoglobin: 14.7 g/dL (ref 13.0–17.0)
LYMPHS ABS: 1.8 10*3/uL (ref 0.7–4.0)
Lymphocytes Relative: 21 %
MCH: 35.5 pg — AB (ref 26.0–34.0)
MCHC: 36 g/dL (ref 30.0–36.0)
MCV: 98.6 fL (ref 78.0–100.0)
MONO ABS: 0.6 10*3/uL (ref 0.1–1.0)
MONOS PCT: 8 %
Neutro Abs: 5.7 10*3/uL (ref 1.7–7.7)
Neutrophils Relative %: 70 %
Platelets: 271 10*3/uL (ref 150–400)
RBC: 4.14 MIL/uL — ABNORMAL LOW (ref 4.22–5.81)
RDW: 13.3 % (ref 11.5–15.5)
WBC: 8.2 10*3/uL (ref 4.0–10.5)

## 2016-11-05 LAB — TSH: TSH: 2.735 u[IU]/mL (ref 0.350–4.500)

## 2016-11-05 NOTE — Patient Instructions (Signed)
[place] at [facility] Discharge Instructions  RECOMMENDATIONS MADE BY THE CONSULTANT AND ANY TEST RESULTS WILL BE SENT TO YOUR REFERRING PHYSICIAN.  Labs and follow up in 6 months  Thank you for choosing [place] at [facility] to provide your oncology and hematology care.  To afford each patient quality time with our provider, please arrive at least 15 minutes before your scheduled appointment time.    If you have a lab appointment with the [place] please come in thru the  Main Entrance and check in at the main information desk  You need to re-schedule your appointment should you arrive 10 or more minutes late.  We strive to give you quality time with our providers, and arriving late affects you and other patients whose appointments are after yours.  Also, if you no show three or more times for appointments you may be dismissed from the clinic at the providers discretion.     Again, thank you for choosing [facility].  Our hope is that these requests will decrease the amount of time that you wait before being seen by our physicians.       _____________________________________________________________  Should you have questions after your visit to [facility], please contact our office at [phone] between the hours of 8:30 a.m. and 4:30 p.m.  Voicemails left after 4:30 p.m. will not be returned until the following business day.  For prescription refill requests, have your pharmacy contact our office.       Resources For Cancer Patients and their Caregivers ? American Cancer Society: Can assist with transportation, wigs, general needs, runs Look Good Feel Better.        681-091-1217 ? Cancer Care: Provides financial assistance, online support groups, medication/co-pay assistance.  1-800-813-HOPE (206)864-1684) ? [place] Assists [place] cancer patients and their families  through emotional , educational and financial support.  (336) 879-7903 ? [place] DSS Where to apply for food stamps, Medicaid and utility assistance. 626-175-3822 ? [facility]: Transportation to medical appointments. 608 540 4417 ? Social Security Administration: May apply for disability if have a Stage IV cancer. 6072272853 540-546-4476 ? [employer], Disability and Transit Services: Assists with nutrition, care and transit needs. [place] Support Programs: @10RELATIVEDAYS @ > Cancer Support Group  2nd Tuesday of the month 1pm-2pm, Journey Room  > Creative Journey  3rd Tuesday of the month 1130am-1pm, Journey Room  > Look Good Feel Better  1st Wednesday of the month 10am-12 noon, Journey Room (Call [place] to register [phone])
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
## 2016-11-06 LAB — T4: T4 TOTAL: 7.8 ug/dL (ref 4.5–12.0)

## 2016-12-06 DIAGNOSIS — I10 Essential (primary) hypertension: Secondary | ICD-10-CM | POA: Diagnosis not present

## 2016-12-06 DIAGNOSIS — Z6821 Body mass index (BMI) 21.0-21.9, adult: Secondary | ICD-10-CM | POA: Diagnosis not present

## 2016-12-28 DIAGNOSIS — Z8521 Personal history of malignant neoplasm of larynx: Secondary | ICD-10-CM | POA: Diagnosis not present

## 2016-12-28 DIAGNOSIS — Z08 Encounter for follow-up examination after completed treatment for malignant neoplasm: Secondary | ICD-10-CM | POA: Diagnosis not present

## 2017-01-03 DIAGNOSIS — C320 Malignant neoplasm of glottis: Secondary | ICD-10-CM | POA: Diagnosis not present

## 2017-01-03 DIAGNOSIS — I6523 Occlusion and stenosis of bilateral carotid arteries: Secondary | ICD-10-CM | POA: Diagnosis not present

## 2017-02-21 ENCOUNTER — Ambulatory Visit (INDEPENDENT_AMBULATORY_CARE_PROVIDER_SITE_OTHER): Payer: Medicare Other | Admitting: Otolaryngology

## 2017-02-21 ENCOUNTER — Other Ambulatory Visit (INDEPENDENT_AMBULATORY_CARE_PROVIDER_SITE_OTHER): Payer: Self-pay | Admitting: Otolaryngology

## 2017-02-21 DIAGNOSIS — K148 Other diseases of tongue: Secondary | ICD-10-CM | POA: Diagnosis not present

## 2017-02-21 DIAGNOSIS — R070 Pain in throat: Secondary | ICD-10-CM

## 2017-02-21 DIAGNOSIS — D3702 Neoplasm of uncertain behavior of tongue: Secondary | ICD-10-CM | POA: Diagnosis not present

## 2017-02-21 DIAGNOSIS — D3709 Neoplasm of uncertain behavior of other specified sites of the oral cavity: Secondary | ICD-10-CM | POA: Diagnosis not present

## 2017-02-21 DIAGNOSIS — R490 Dysphonia: Secondary | ICD-10-CM

## 2017-03-07 ENCOUNTER — Ambulatory Visit (INDEPENDENT_AMBULATORY_CARE_PROVIDER_SITE_OTHER): Payer: Medicare Other | Admitting: Otolaryngology

## 2017-03-07 DIAGNOSIS — F17210 Nicotine dependence, cigarettes, uncomplicated: Secondary | ICD-10-CM | POA: Diagnosis not present

## 2017-03-07 DIAGNOSIS — D3709 Neoplasm of uncertain behavior of other specified sites of the oral cavity: Secondary | ICD-10-CM

## 2017-04-07 DIAGNOSIS — I10 Essential (primary) hypertension: Secondary | ICD-10-CM | POA: Diagnosis not present

## 2017-04-07 DIAGNOSIS — J439 Emphysema, unspecified: Secondary | ICD-10-CM | POA: Diagnosis not present

## 2017-04-07 DIAGNOSIS — I700 Atherosclerosis of aorta: Secondary | ICD-10-CM | POA: Diagnosis not present

## 2017-05-06 ENCOUNTER — Other Ambulatory Visit (HOSPITAL_COMMUNITY): Payer: Medicare Other

## 2017-05-06 ENCOUNTER — Ambulatory Visit (HOSPITAL_COMMUNITY): Payer: Medicare Other

## 2017-05-16 ENCOUNTER — Ambulatory Visit (INDEPENDENT_AMBULATORY_CARE_PROVIDER_SITE_OTHER): Payer: Medicare Other | Admitting: Otolaryngology

## 2017-05-16 DIAGNOSIS — D3702 Neoplasm of uncertain behavior of tongue: Secondary | ICD-10-CM

## 2017-05-16 DIAGNOSIS — J382 Nodules of vocal cords: Secondary | ICD-10-CM

## 2017-06-13 ENCOUNTER — Encounter (HOSPITAL_COMMUNITY): Payer: Self-pay

## 2017-06-13 ENCOUNTER — Encounter (HOSPITAL_COMMUNITY): Payer: Medicare Other | Attending: Oncology

## 2017-06-13 ENCOUNTER — Ambulatory Visit (HOSPITAL_COMMUNITY): Payer: Medicare Other

## 2017-06-13 ENCOUNTER — Other Ambulatory Visit (HOSPITAL_COMMUNITY): Payer: Medicare Other

## 2017-06-13 ENCOUNTER — Encounter (HOSPITAL_BASED_OUTPATIENT_CLINIC_OR_DEPARTMENT_OTHER): Payer: Medicare Other | Admitting: Oncology

## 2017-06-13 VITALS — BP 148/84 | HR 70 | Resp 16 | Ht 76.0 in | Wt 171.0 lb

## 2017-06-13 DIAGNOSIS — Z7982 Long term (current) use of aspirin: Secondary | ICD-10-CM | POA: Insufficient documentation

## 2017-06-13 DIAGNOSIS — Z923 Personal history of irradiation: Secondary | ICD-10-CM | POA: Insufficient documentation

## 2017-06-13 DIAGNOSIS — Z720 Tobacco use: Secondary | ICD-10-CM | POA: Diagnosis not present

## 2017-06-13 DIAGNOSIS — F17210 Nicotine dependence, cigarettes, uncomplicated: Secondary | ICD-10-CM | POA: Diagnosis not present

## 2017-06-13 DIAGNOSIS — Z8521 Personal history of malignant neoplasm of larynx: Secondary | ICD-10-CM

## 2017-06-13 DIAGNOSIS — G8929 Other chronic pain: Secondary | ICD-10-CM | POA: Diagnosis not present

## 2017-06-13 DIAGNOSIS — M549 Dorsalgia, unspecified: Secondary | ICD-10-CM | POA: Insufficient documentation

## 2017-06-13 DIAGNOSIS — I10 Essential (primary) hypertension: Secondary | ICD-10-CM | POA: Insufficient documentation

## 2017-06-13 DIAGNOSIS — Z79899 Other long term (current) drug therapy: Secondary | ICD-10-CM | POA: Diagnosis not present

## 2017-06-13 DIAGNOSIS — M109 Gout, unspecified: Secondary | ICD-10-CM | POA: Diagnosis not present

## 2017-06-13 DIAGNOSIS — K219 Gastro-esophageal reflux disease without esophagitis: Secondary | ICD-10-CM | POA: Diagnosis not present

## 2017-06-13 DIAGNOSIS — M542 Cervicalgia: Secondary | ICD-10-CM | POA: Diagnosis not present

## 2017-06-13 DIAGNOSIS — C320 Malignant neoplasm of glottis: Secondary | ICD-10-CM

## 2017-06-13 LAB — CBC WITH DIFFERENTIAL/PLATELET
BASOS ABS: 0 10*3/uL (ref 0.0–0.1)
Basophils Relative: 0 %
Eosinophils Absolute: 0.1 10*3/uL (ref 0.0–0.7)
Eosinophils Relative: 1 %
HEMATOCRIT: 39.7 % (ref 39.0–52.0)
Hemoglobin: 14.4 g/dL (ref 13.0–17.0)
LYMPHS PCT: 23 %
Lymphs Abs: 1.8 10*3/uL (ref 0.7–4.0)
MCH: 35.9 pg — ABNORMAL HIGH (ref 26.0–34.0)
MCHC: 36.3 g/dL — ABNORMAL HIGH (ref 30.0–36.0)
MCV: 99 fL (ref 78.0–100.0)
MONO ABS: 0.7 10*3/uL (ref 0.1–1.0)
Monocytes Relative: 9 %
NEUTROS ABS: 5.2 10*3/uL (ref 1.7–7.7)
NEUTROS PCT: 67 %
Platelets: 262 10*3/uL (ref 150–400)
RBC: 4.01 MIL/uL — ABNORMAL LOW (ref 4.22–5.81)
RDW: 13 % (ref 11.5–15.5)
WBC: 7.7 10*3/uL (ref 4.0–10.5)

## 2017-06-13 LAB — COMPREHENSIVE METABOLIC PANEL
ALBUMIN: 4.1 g/dL (ref 3.5–5.0)
ALK PHOS: 74 U/L (ref 38–126)
ALT: 17 U/L (ref 17–63)
AST: 20 U/L (ref 15–41)
Anion gap: 8 (ref 5–15)
BILIRUBIN TOTAL: 0.7 mg/dL (ref 0.3–1.2)
BUN: 8 mg/dL (ref 6–20)
CALCIUM: 8.8 mg/dL — AB (ref 8.9–10.3)
CO2: 26 mmol/L (ref 22–32)
CREATININE: 0.72 mg/dL (ref 0.61–1.24)
Chloride: 91 mmol/L — ABNORMAL LOW (ref 101–111)
GFR calc Af Amer: >60 mL/min (ref >60)
GLUCOSE: 77 mg/dL (ref 65–99)
Potassium: 3.9 mmol/L (ref 3.5–5.1)
Sodium: 125 mmol/L — ABNORMAL LOW (ref 135–145)
TOTAL PROTEIN: 7.2 g/dL (ref 6.5–8.1)

## 2017-06-13 LAB — TSH: TSH: 2.575 u[IU]/mL (ref 0.350–4.500)
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
## 2017-06-29 DIAGNOSIS — Z923 Personal history of irradiation: Secondary | ICD-10-CM | POA: Diagnosis not present

## 2017-06-29 DIAGNOSIS — C329 Malignant neoplasm of larynx, unspecified: Secondary | ICD-10-CM | POA: Diagnosis not present

## 2017-06-29 DIAGNOSIS — Z08 Encounter for follow-up examination after completed treatment for malignant neoplasm: Secondary | ICD-10-CM | POA: Diagnosis not present

## 2017-06-29 DIAGNOSIS — K149 Disease of tongue, unspecified: Secondary | ICD-10-CM | POA: Diagnosis not present

## 2017-06-29 DIAGNOSIS — Z8521 Personal history of malignant neoplasm of larynx: Secondary | ICD-10-CM | POA: Diagnosis not present

## 2017-07-14 DIAGNOSIS — I10 Essential (primary) hypertension: Secondary | ICD-10-CM | POA: Diagnosis not present

## 2017-07-14 DIAGNOSIS — J449 Chronic obstructive pulmonary disease, unspecified: Secondary | ICD-10-CM | POA: Diagnosis not present

## 2017-07-14 DIAGNOSIS — Z23 Encounter for immunization: Secondary | ICD-10-CM | POA: Diagnosis not present

## 2017-07-14 DIAGNOSIS — E871 Hypo-osmolality and hyponatremia: Secondary | ICD-10-CM | POA: Diagnosis not present

## 2017-08-15 ENCOUNTER — Ambulatory Visit (INDEPENDENT_AMBULATORY_CARE_PROVIDER_SITE_OTHER): Payer: Medicare Other | Admitting: Otolaryngology

## 2017-08-15 DIAGNOSIS — D3702 Neoplasm of uncertain behavior of tongue: Secondary | ICD-10-CM

## 2017-11-10 DIAGNOSIS — K1370 Unspecified lesions of oral mucosa: Secondary | ICD-10-CM | POA: Diagnosis not present

## 2017-11-10 DIAGNOSIS — M5412 Radiculopathy, cervical region: Secondary | ICD-10-CM | POA: Diagnosis not present

## 2017-11-10 DIAGNOSIS — I10 Essential (primary) hypertension: Secondary | ICD-10-CM | POA: Diagnosis not present

## 2017-11-10 DIAGNOSIS — Z6821 Body mass index (BMI) 21.0-21.9, adult: Secondary | ICD-10-CM | POA: Diagnosis not present

## 2017-11-14 ENCOUNTER — Ambulatory Visit (INDEPENDENT_AMBULATORY_CARE_PROVIDER_SITE_OTHER): Payer: Medicare Other | Admitting: Otolaryngology

## 2017-11-14 ENCOUNTER — Other Ambulatory Visit (INDEPENDENT_AMBULATORY_CARE_PROVIDER_SITE_OTHER): Payer: Self-pay | Admitting: Otolaryngology

## 2017-11-14 DIAGNOSIS — F17210 Nicotine dependence, cigarettes, uncomplicated: Secondary | ICD-10-CM

## 2017-11-14 DIAGNOSIS — D3702 Neoplasm of uncertain behavior of tongue: Secondary | ICD-10-CM | POA: Diagnosis not present

## 2017-11-14 DIAGNOSIS — C029 Malignant neoplasm of tongue, unspecified: Secondary | ICD-10-CM | POA: Diagnosis not present

## 2017-11-16 ENCOUNTER — Other Ambulatory Visit (INDEPENDENT_AMBULATORY_CARE_PROVIDER_SITE_OTHER): Payer: Self-pay | Admitting: Otolaryngology

## 2017-11-16 DIAGNOSIS — C029 Malignant neoplasm of tongue, unspecified: Secondary | ICD-10-CM

## 2017-11-23 ENCOUNTER — Encounter (HOSPITAL_COMMUNITY): Payer: Self-pay

## 2017-11-23 ENCOUNTER — Ambulatory Visit (HOSPITAL_COMMUNITY): Payer: Medicare Other

## 2017-11-28 DIAGNOSIS — C029 Malignant neoplasm of tongue, unspecified: Secondary | ICD-10-CM | POA: Diagnosis not present

## 2017-11-30 DIAGNOSIS — C029 Malignant neoplasm of tongue, unspecified: Secondary | ICD-10-CM | POA: Diagnosis not present

## 2017-11-30 DIAGNOSIS — Z923 Personal history of irradiation: Secondary | ICD-10-CM | POA: Diagnosis not present

## 2017-11-30 DIAGNOSIS — Z8521 Personal history of malignant neoplasm of larynx: Secondary | ICD-10-CM | POA: Diagnosis not present

## 2017-12-08 ENCOUNTER — Other Ambulatory Visit (HOSPITAL_COMMUNITY): Payer: Self-pay | Admitting: Otolaryngology

## 2017-12-08 DIAGNOSIS — C029 Malignant neoplasm of tongue, unspecified: Secondary | ICD-10-CM

## 2017-12-13 ENCOUNTER — Ambulatory Visit (HOSPITAL_COMMUNITY): Payer: Medicare Other | Admitting: Adult Health

## 2017-12-20 ENCOUNTER — Ambulatory Visit (HOSPITAL_COMMUNITY)
Admission: RE | Admit: 2017-12-20 | Discharge: 2017-12-20 | Disposition: A | Discharge status: Home / Self Care | Payer: Medicare Other | Source: Ambulatory Visit | Attending: Otolaryngology | Admitting: Otolaryngology

## 2017-12-20 DIAGNOSIS — R911 Solitary pulmonary nodule: Secondary | ICD-10-CM | POA: Insufficient documentation

## 2017-12-20 DIAGNOSIS — I700 Atherosclerosis of aorta: Secondary | ICD-10-CM | POA: Diagnosis not present

## 2017-12-20 DIAGNOSIS — J439 Emphysema, unspecified: Secondary | ICD-10-CM | POA: Diagnosis not present

## 2017-12-20 DIAGNOSIS — C029 Malignant neoplasm of tongue, unspecified: Secondary | ICD-10-CM | POA: Insufficient documentation

## 2017-12-20 DIAGNOSIS — I712 Thoracic aortic aneurysm, without rupture: Secondary | ICD-10-CM | POA: Insufficient documentation

## 2017-12-20 DIAGNOSIS — M270 Developmental disorders of jaws: Secondary | ICD-10-CM | POA: Diagnosis not present

## 2017-12-20 LAB — POCT I-STAT CREATININE: Creatinine, Ser: 0.9 mg/dL (ref 0.61–1.24)

## 2017-12-20 MED ORDER — IOPAMIDOL (ISOVUE-300) INJECTION 61%
100.0000 mL | Freq: Once | INTRAVENOUS | Status: AC | PRN
  Administered 2017-12-20: 100 mL via INTRAVENOUS

## 2018-01-11 ENCOUNTER — Inpatient Hospital Stay (HOSPITAL_COMMUNITY): Payer: Medicare Other | Attending: Hematology | Admitting: Hematology

## 2018-01-11 ENCOUNTER — Encounter (HOSPITAL_COMMUNITY): Payer: Self-pay | Admitting: Hematology

## 2018-01-11 VITALS — BP 131/73 | HR 106 | Temp 97.3°F | Resp 18 | Wt 168.0 lb

## 2018-01-11 DIAGNOSIS — C320 Malignant neoplasm of glottis: Secondary | ICD-10-CM | POA: Insufficient documentation

## 2018-01-11 DIAGNOSIS — C029 Malignant neoplasm of tongue, unspecified: Secondary | ICD-10-CM | POA: Insufficient documentation

## 2018-01-11 DIAGNOSIS — Z923 Personal history of irradiation: Secondary | ICD-10-CM | POA: Insufficient documentation

## 2018-01-11 DIAGNOSIS — Z8521 Personal history of malignant neoplasm of larynx: Secondary | ICD-10-CM

## 2018-01-11 DIAGNOSIS — R911 Solitary pulmonary nodule: Secondary | ICD-10-CM | POA: Insufficient documentation

## 2018-01-11 NOTE — Progress Notes (Signed)
Patient Care Team: [name], MD as PCP - General (Internal Medicine)  DIAGNOSIS:  Encounter Diagnoses  Name Primary?  . Squamous cell carcinoma of tongue (HCC) Yes  . Carcinoma of true vocal cord ([place])   . Lung nodule   . Tongue cancer ([place])     SUMMARY OF ONCOLOGIC HISTORY:   Carcinoma of true vocal cord ([place])   06/20/2015 Initial Diagnosis    Carcinoma of true vocal cord ([place])      07/14/2015 - 08/22/2015 Radiation Therapy    63 Gy in 28 fractions by Dr. 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 Treated with definitive radiation therapy in 2016.  Seen Dr. Conley Canal (ENT) at [facility] for left sided anterior tongue lesion.  Biopsy was performed on 11/15/17 demonstrating squamous cell carcinoma.   Reportedly he has difficulty lying flat after he completed his radiation; "my gag reflex is messed up, so it's hard for me to lie flat because I get choked on my saliva."   He has been unable to lie flat for PET scan. He was able to complete CT scans recently.    Had recent CT neck and CT chest on 12/20/17; this did not demonstrate evidence of definitive metastatic disease. There is a lung nodule to RLL, measuring 7 x 10 mm; radiologist recommends repeat imaging in 3 months either with CT chest or PET/CT.  We have low clinical suspicion that the lung lesion is metastatic squamous cell carcinoma.   Reports frustration that he did not receive return call from [facility] regarding his scan results and next steps.  Today, he was offered referral back to [facility] with Dr. Conley Canal or with another one of his colleagues vs [facility] in [place] for surgery consideration. He wants to talk to his wife re: this option. He was given instructions to call us  with his decision.    Will plan to collect TSH at next visit.        REVIEW OF SYSTEMS:   Constitutional: Denies fevers, chills or abnormal weight loss Eyes: Denies blurriness of vision Ears, nose, mouth, throat, and face: Denies mucositis or sore throat Respiratory: Denies cough, dyspnea or wheezes Cardiovascular: Denies palpitation, chest discomfort Gastrointestinal:  Denies nausea, heartburn or change in bowel habits Skin: Denies abnormal skin rashes Lymphatics: Denies new lymphadenopathy or easy bruising Neurological:Denies numbness, tingling or new weaknesses Behavioral/Psych: Mood is stable, no new changes  Extremities: No lower extremity edema All other systems were reviewed with the patient and are negative.  I have reviewed the past medical history, past surgical history, social history and family history with the patient and they are unchanged from previous note.  ALLERGIES:  has No Known Allergies.  MEDICATIONS:  Current Outpatient Medications  Medication Sig Dispense Refill  . acetaminophen (TYLENOL) 500 MG tablet Take 100 mg by mouth every 6 (six) hours as needed for mild pain.    Marland Kitchen amLODipine (NORVASC) 5 MG tablet Take 5 mg by mouth daily.    Marland Kitchen aspirin EC 81 MG tablet Take 81 mg by mouth daily.    . cyclobenzaprine (FLEXERIL) 10 MG tablet Take 10 mg by mouth as needed for muscle spasms.    . diclofenac (  VOLTAREN) 75 MG EC tablet Take 75 mg by mouth daily as needed for mild pain.    Marland Kitchen losartan (COZAAR) 50 MG tablet      No current facility-administered medications for this visit.     PHYSICAL EXAMINATION: ECOG PERFORMANCE STATUS: 1 - Symptomatic but completely ambulatory  I have reviewed the vitals. GENERAL:alert, no distress and comfortable SKIN: skin color, texture, turgor are normal, no rashes or significant lesions EYES: normal, Conjunctiva are pink and non-injected, sclera clear OROPHARYNX: There is a nodular lesion on the left side of the tip of the  tongue.  NECK: supple, thyroid normal size, non-tender, without nodularity LYMPH:  no palpable lymphadenopathy in the cervical, axillary or inguinal LUNGS: clear to auscultation and percussion with normal breathing effort HEART: regular rate & rhythm and no murmurs and no lower extremity edema ABDOMEN:abdomen soft, non-tender and normal bowel sounds MUSCULOSKELETAL:no cyanosis of digits and no clubbing  EXTREMITIES: No lower extremity edema   LABORATORY DATA:  I have reviewed the data as listed CMP Latest Ref Rng & Units 12/20/2017 06/13/2017 11/05/2016  Glucose 65 - 99 mg/dL - 77 92  BUN 6 - 20 mg/dL - 8 10  Creatinine 0.61 - 1.24 mg/dL 0.90 0.72 0.70  Sodium 135 - 145 mmol/L - 125(L) 128(L)  Potassium 3.5 - 5.1 mmol/L - 3.9 4.2  Chloride 101 - 111 mmol/L - 91(L) 95(L)  CO2 22 - 32 mmol/L - 26 26  Calcium 8.9 - 10.3 mg/dL - 8.8(L) 8.7(L)  Total Protein 6.5 - 8.1 g/dL - 7.2 7.2  Total Bilirubin 0.3 - 1.2 mg/dL - 0.7 0.7  Alkaline Phos 38 - 126 U/L - 74 68  AST 15 - 41 U/L - 20 19  ALT 17 - 63 U/L - 17 15(L)   No results found for: [id number]   Lab Results  Component Value Date   WBC 7.7 06/13/2017   HGB 14.4 06/13/2017   HCT 39.7 06/13/2017   MCV 99.0 06/13/2017   PLT 262 06/13/2017   NEUTROABS 5.2 06/13/2017     PATHOLOGY:  (L) tongue biopsy: 11/15/17 (done at [facility]; obtained by [facility])    DIAGNOSTIC IMAGING:  CT soft tissue neck: 12/20/17 CLINICAL DATA:  Recent diagnosis of tongue cancer. No treatment. History of laryngeal cancer 2 years prior. Radiation treatment at that time.  EXAM: CT NECK WITH CONTRAST  TECHNIQUE: Multidetector CT imaging of the neck was performed using the standard protocol following the bolus administration of intravenous contrast.  CONTRAST:  146mL ISOVUE-300 IOPAMIDOL (ISOVUE-300) INJECTION 61%  COMPARISON:  CT neck 06/26/2015  FINDINGS: PHARYNX AND LARYNX:  --Nasopharynx: Fossae of Rosenmuller are clear. Normal  adenoid tonsils for age.  --Oral cavity and oropharynx: The visualization of the oral cavity is largely obscured by streak artifact from dental amalgam. Large torus mandibularis. Unchanged bilateral laterally projecting mandibular osteomas. Palatine tonsils and oropharynx are normal.  --Hypopharynx: Normal vallecula and pyriform sinuses.  --Larynx: Normal epiglottis and pre-epiglottic space. Normal aryepiglottic and vocal folds.  --Retropharyngeal space: No abscess, effusion or lymphadenopathy.  SALIVARY GLANDS:  --Parotid: No mass lesion or inflammation. No sialolithiasis or ductal dilatation.  --Submandibular: Symmetric without inflammation. No sialolithiasis or ductal dilatation.  --Sublingual: Normal. No ranula or other visible lesion of the base of tongue and floor of mouth.  THYROID: Normal.  LYMPH NODES: No enlarged or abnormal density lymph nodes.  VASCULAR: Calcific aortic atherosclerosis.  LIMITED INTRACRANIAL: Normal.  VISUALIZED ORBITS: Normal.  MASTOIDS AND VISUALIZED PARANASAL SINUSES: No fluid levels or  advanced mucosal thickening. No mastoid effusion.  SKELETON: No bony spinal canal stenosis. No lytic or blastic lesions.  UPPER CHEST: Emphysema.  OTHER: None.  IMPRESSION: 1. Limited visualization of the tongue due to streak artifact from dental amalgam. No mass lesion is visible. 2. No cervical lymphadenopathy. 3. Aortic Atherosclerosis (ICD10-I70.0) and Emphysema (ICD10-J43.9). 4. Unchanged appearance of large torus mandibularis and bilateral laterally projecting mandibular osteomas.   Electronically Signed   By: Ulyses Jarred M.D.   On: 12/21/2017 03:54    CT chest: 12/20/17 CLINICAL DATA:  Tongue cancer.  EXAM: CT CHEST WITH CONTRAST  TECHNIQUE: Multidetector CT imaging of the chest was performed during intravenous contrast administration.  CONTRAST:  161mL ISOVUE-300 IOPAMIDOL (ISOVUE-300) INJECTION  61%  COMPARISON:  07/07/2016  FINDINGS: Cardiovascular: The heart size is normal. No pericardial effusion. Coronary artery calcification is evident. Ascending thoracic aorta measures up to 4.2 cm diameter. Atherosclerotic calcification is noted in the wall of the thoracic aorta.  Mediastinum/Nodes: No mediastinal lymphadenopathy. There is no hilar lymphadenopathy. The esophagus has normal imaging features. There is no axillary lymphadenopathy.  Lungs/Pleura: Centrilobular and paraseptal emphysema noted. Interval development of irregular 7 x 10 mm right lower lobe nodule (image 131/series 4). Stable 4 mm posterior left lower lobe subpleural nodule (121/4). No focal airspace consolidation. No pulmonary edema or pleural effusion.  Upper Abdomen: Similar appearance of bilateral adrenal gland thickening since prior study and comparing back to 06/26/2015.  Musculoskeletal: Bone windows reveal no worrisome lytic or sclerotic osseous lesions.  IMPRESSION: 1. Interval development of 7 x 10 mm right lower lobe pulmonary nodule. Consider one of the following in 3 months for both low-risk and high-risk individuals: (a) repeat chest CT, (b) follow-up PET-CT, or (c) tissue sampling. This recommendation follows the consensus statement: Guidelines for Management of Incidental Pulmonary Nodules Detected on CT Images: From the Fleischner Society 2017; Radiology 2017; 284:228-243. 2.  Emphysema. (ICD10-J43.9) 3.  Aortic Atherosclerois (ICD10-170.0) 4. 4.2 cm diameter ascending thoracic aortic aneurysm. Recommend annual imaging followup by CTA or MRA. This recommendation follows 2010 ACCF/AHA/AATS/ACR/ASA/SCA/SCAI/SIR/STS/SVM Guidelines for the Diagnosis and Management of Patients with Thoracic Aortic Disease. Circulation. 2010; 121: F790-W409  These results will be called to the ordering clinician or representative by the Radiologist Assistant, and communication documented in the  PACS or zVision Dashboard.   Electronically Signed   By: Misty Stanley M.D.   On: 12/21/2017 09:45     ASSESSMENT & PLAN:  Carcinoma of true vocal cord 1.  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 Conley Canal at [facility], recommended to have a PET/CT scan.  As patient could not lie on his back for a long time, a CT scan of the neck and chest was done.  He is upset that nobody has followed up with him about the scan results.  We have discussed the results of the CT scan of the neck which was negative for any metastatic adenopathy.  CT scan of the chest showed 7 x 10 mm right lower lobe lung nodule, which needs follow-up.  We have encouraged him to follow-up with Dr. Conley Canal or one of his colleagues at [facility].  We have also given the option of going to [facility].  Patient would like to talk to his wife and get back to [place] by tomorrow.  We will make the referral process accordingly.  2.  Stage I (cT1 N0 M0)  laryngeal cancer: -Status post 6300 cGy, definitive radiation from 07/14/2015 through 08/22/2015 -No evidence of adenopathy on recent CT scan.  He will undergo periodic direct laryngeal examination by Dr. Benjamine Mola.  His last TSH was in September and was normal.  We will obtain another TSH at next visit.   Total time spent is 25 minutes with more than 50% of the time spent face-to-face discussing scan results and treatment options.  Orders Placed This Encounter  Procedures  . CT Chest Wo Contrast    Standing Status:   Future    Standing Expiration Date:   01/11/2019    Order Specific Question:   Preferred imaging location?    Answer:   [facility]    Order Specific Question:   Radiology Contrast Protocol - do NOT remove file path    Answer:   [email or web address]    Order Specific Question:   Reason for Exam  additional comments    Answer:   h/o vocal cord cancer; newly diagnosed tongue cancer. Lung nodule; 61-month FU exam for re-evaluation.  . TSH    Standing Status:   Future    Standing Expiration Date:   01/11/2019   The patient has a good understanding of the overall plan. he agrees with it. he will call with any problems that may develop before the next visit here.  Derek Jack, MD 01/11/18

## 2018-01-11 NOTE — Assessment & Plan Note (Addendum)
1.  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 Conley Canal at [facility], recommended to have a PET/CT scan.  As patient could not lie on his back for a long time, a CT scan of the neck and chest was done.  He is upset that nobody has followed up with him about the scan results.  We have discussed the results of the CT scan of the neck which was negative for any metastatic adenopathy.  CT scan of the chest showed 7 x 10 mm right lower lobe lung nodule, which needs follow-up.  We have encouraged him to follow-up with Dr. Conley Canal or one of his colleagues at [facility].  We have also given the option of going to [facility].  Patient would like to talk to his wife and get back to [place] by tomorrow.  We will make the referral process accordingly.  2.  Stage I (cT1 N0 M0) laryngeal cancer: -Status post 6300 cGy, definitive radiation from 07/14/2015 through 08/22/2015 -No evidence of adenopathy on recent CT scan.  He will undergo periodic direct laryngeal examination by Dr. Benjamine Mola.  His last TSH was in September and was normal.  We will obtain another TSH at next visit.

## 2018-01-11 NOTE — Patient Instructions (Signed)
[facility] at [facility] Discharge Instructions  Today you saw Dr. K.   Thank you for choosing [facility] at [facility] to provide your oncology and hematology care.  To afford each patient quality time with our provider, please arrive at least 15 minutes before your scheduled appointment time.   If you have a lab appointment with the Cancer Center please come in thru the  Main Entrance and check in at the main information desk  You need to re-schedule your appointment should you arrive 10 or more minutes late.  We strive to give you quality time with our providers, and arriving late affects you and other patients whose appointments are after yours.  Also, if you no show three or more times for appointments you may be dismissed from the clinic at the providers discretion.     Again, thank you for choosing [facility].  Our hope is that these requests will decrease the amount of time that you wait before being seen by our physicians.       _____________________________________________________________  Should you have questions after your visit to [facility], please contact our office at [phone] between the hours of 8:30 a.m. and 4:30 p.m.  Voicemails left after 4:30 p.m. will not be returned until the following business day.  For prescription refill requests, have your pharmacy contact our office.       Resources For Cancer Patients and their Caregivers ? American Cancer Society: Can assist with transportation, wigs, general needs, runs Look Good Feel Better.        1-888-227-6333 ? Cancer Care: Provides financial assistance, online support groups, medication/co-pay assistance.  1-800-813-HOPE (4673) ? [facility] Assists [place] cancer patients and their families through emotional , educational and financial support.  336-427-4357 ? [place] DSS Where to apply for food  stamps, Medicaid and utility assistance. 336-342-1394 ? [facility]: Transportation to medical appointments. 336-347-2287 ? Social Security Administration: May apply for disability if have a Stage IV cancer. 336-342-7796 1-800-772-1213 ? [place] Aging, Disability and Transit Services: Assists with nutrition, care and transit needs. 336-349-2343  Cancer Center Support Programs:   > Cancer Support Group  2nd Tuesday of the month 1pm-2pm, Journey Room   > Creative Journey  3rd Tuesday of the month 1130am-1pm, Journey Room    

## 2018-01-12 ENCOUNTER — Telehealth (HOSPITAL_COMMUNITY): Payer: Self-pay | Admitting: Adult Health

## 2018-01-12 NOTE — Telephone Encounter (Signed)
Mr. Routh called the cancer center today to provide an update on his decision regarding where he would like to seek surgical care for his recently diagnosed tongue cancer. We had discussion at his office visit yesterday with Dr. Delton Coombes regarding his options, which included returning to [facility] to see Dr. Conley Canal again, seeing another ENT surgeon at [facility], or referral to [facility] for additional opinion/evaluation.   Mr. Cham tells me that he has decided to return to [facility] to see Dr. Conley Canal.  "I am not happy about it, but I think it's probably going to be the best thing since my records are there."  Provided verbal support for his decision. I will have our team reach out to Dr. Jenne Campus team at [facility] to help facilitate a return visit for Mr. Souter (it appears there was quite a bit of miscommunication after his last visit and after his recent CT scans).  '  Encouraged Mr. Elvin to call [facility] directly if he has not heard from Dr. Jenne Campus team within the next week.  He voiced understanding.  We will see him for follow-up here at the cancer center as previously discussed at recent office visit.    Mike Craze, NP [place] [phone]

## 2018-01-27 DIAGNOSIS — Z923 Personal history of irradiation: Secondary | ICD-10-CM | POA: Diagnosis not present

## 2018-01-27 DIAGNOSIS — Z8521 Personal history of malignant neoplasm of larynx: Secondary | ICD-10-CM | POA: Diagnosis not present

## 2018-01-27 DIAGNOSIS — C029 Malignant neoplasm of tongue, unspecified: Secondary | ICD-10-CM | POA: Diagnosis not present

## 2018-02-27 DIAGNOSIS — C029 Malignant neoplasm of tongue, unspecified: Secondary | ICD-10-CM | POA: Diagnosis not present

## 2018-02-27 DIAGNOSIS — I712 Thoracic aortic aneurysm, without rupture: Secondary | ICD-10-CM | POA: Diagnosis not present

## 2018-02-27 DIAGNOSIS — M5412 Radiculopathy, cervical region: Secondary | ICD-10-CM | POA: Diagnosis not present

## 2018-04-12 ENCOUNTER — Ambulatory Visit (HOSPITAL_COMMUNITY)
Admission: RE | Admit: 2018-04-12 | Discharge: 2018-04-12 | Disposition: A | Discharge status: Home / Self Care | Payer: Medicare Other | Source: Ambulatory Visit | Attending: Adult Health | Admitting: Adult Health

## 2018-04-12 DIAGNOSIS — C029 Malignant neoplasm of tongue, unspecified: Secondary | ICD-10-CM | POA: Insufficient documentation

## 2018-04-12 DIAGNOSIS — I77810 Thoracic aortic ectasia: Secondary | ICD-10-CM | POA: Diagnosis not present

## 2018-04-12 DIAGNOSIS — R911 Solitary pulmonary nodule: Secondary | ICD-10-CM | POA: Insufficient documentation

## 2018-04-12 DIAGNOSIS — I2510 Atherosclerotic heart disease of native coronary artery without angina pectoris: Secondary | ICD-10-CM | POA: Diagnosis not present

## 2018-04-12 DIAGNOSIS — J439 Emphysema, unspecified: Secondary | ICD-10-CM | POA: Insufficient documentation

## 2018-04-12 DIAGNOSIS — C320 Malignant neoplasm of glottis: Secondary | ICD-10-CM | POA: Diagnosis not present

## 2018-04-12 DIAGNOSIS — I700 Atherosclerosis of aorta: Secondary | ICD-10-CM | POA: Insufficient documentation

## 2018-04-14 ENCOUNTER — Inpatient Hospital Stay (HOSPITAL_COMMUNITY): Payer: Medicare Other | Attending: Hematology | Admitting: Hematology

## 2018-04-14 ENCOUNTER — Encounter (HOSPITAL_COMMUNITY): Payer: Self-pay | Admitting: Hematology

## 2018-04-14 DIAGNOSIS — Z923 Personal history of irradiation: Secondary | ICD-10-CM | POA: Insufficient documentation

## 2018-04-14 DIAGNOSIS — R911 Solitary pulmonary nodule: Secondary | ICD-10-CM | POA: Insufficient documentation

## 2018-04-14 DIAGNOSIS — F17210 Nicotine dependence, cigarettes, uncomplicated: Secondary | ICD-10-CM | POA: Insufficient documentation

## 2018-04-14 DIAGNOSIS — Z8521 Personal history of malignant neoplasm of larynx: Secondary | ICD-10-CM | POA: Diagnosis not present

## 2018-04-14 DIAGNOSIS — C029 Malignant neoplasm of tongue, unspecified: Secondary | ICD-10-CM | POA: Insufficient documentation

## 2018-04-14 DIAGNOSIS — I10 Essential (primary) hypertension: Secondary | ICD-10-CM

## 2018-04-14 DIAGNOSIS — G893 Neoplasm related pain (acute) (chronic): Secondary | ICD-10-CM | POA: Diagnosis not present

## 2018-04-14 NOTE — Patient Instructions (Signed)
[facility]  Discharge Instructions:  You were seen by Dr. K today _______________________________________________________________  Thank you for choosing [facility] at [facility] to provide your oncology and hematology care.  To afford each patient quality time with our providers, please arrive at least 15 minutes before your scheduled appointment.  You need to re-schedule your appointment if you arrive 10 or more minutes late.  We strive to give you quality time with our providers, and arriving late affects you and other patients whose appointments are after yours.  Also, if you no show three or more times for appointments you may be dismissed from the clinic.  Again, thank you for choosing [facility] at [facility]. Our hope is that these requests will allow you access to exceptional care and in a timely manner. _______________________________________________________________  If you have questions after your visit, please contact our office at [phone] between the hours of 8:30 a.m. and 5:00 p.m. Voicemails left after 4:30 p.m. will not be returned until the following business day. _______________________________________________________________  For prescription refill requests, have your pharmacy contact our office. _______________________________________________________________  Recommendations made by the consultant and any test results will be sent to your referring physician. _______________________________________________________________ 

## 2018-04-14 NOTE — Assessment & Plan Note (Addendum)
1.  Moderately differentiated squamous cell carcinoma of the left lateral tongue (T2 N0 M0): - Biopsy on 11/15/2017 shows moderately differentiated keratinizing squamous cell carcinoma -Seen by Dr. Conley Canal at [facility], recommended to have a PET/CT scan.  As patient could not lie on his back for a long time, a CT scan of the neck and chest was done.  He did not have any metastatic disease. - He was seen by Dr. Conley Canal on 01/27/2018.  He was recommended to have a left partial glossectomy, bilateral selective neck dissection and left radial forearm free flap reconstruction.  He would also need a feeding tube placement at that time as he will be unable to eat by mouth for 3 to 4 weeks. - Mr. Geimer has decided to not to undergo surgery.  He reports that there are too many things going on in his life.  He does not want to go through the trouble of surgery, feeding tube and rehab.  He is reportedly already making plans to find help for his wife when he is gone.  He clearly understands the consequences of his decision. -He is having pain in his tongue for which he is taking Percocet 5 mg 1 to 2 tablets/day.  Dr. Willey Blade is managing his pain. - I have reviewed the results of the CT of the chest dated 04/13/2018 which was done for follow-up of lung nodule.  Right lower lobe lung nodule was stable.  New subpleural 4 mm left lower lobe lung nodule was seen. - We will give him a follow-up visit in 3 months to see how he is doing and if he needs any help with palliative care.  He is agreeable to this.  2.  Stage I (cT1 N0 M0) laryngeal cancer: -Status post 6300 cGy, definitive radiation from 07/14/2015 through 08/22/2015 -No evidence of adenopathy on recent CT scan.  He will undergo periodic direct laryngeal examination by Dr. Benjamine Mola.  His last TSH was in September and was normal.

## 2018-04-14 NOTE — Progress Notes (Signed)
[place], [place] [address]   CLINIC:  Medical Oncology/Hematology  PCP:  [name], MD [address] [place] [address] [phone]   REASON FOR VISIT:  Follow-up for tongue cancer and lung nodule.  CURRENT THERAPY: None.  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 Isidore Moos        CANCER STAGING: Cancer Staging Carcinoma of true vocal cord [facility]) Staging form: Larynx - Glottis, AJCC 7th Edition - Clinical stage from 06/26/2015: Stage I (T1a, N0, M0) - Signed by Baird Cancer, PA-C on 12/23/2015    INTERVAL HISTORY:  Kevin Durham 72 y.o. male returns for follow-up of left tongue cancer.  We have made a follow-up visit for him with Dr. Conley Canal at [facility].  He was seen by him on 01/27/2018.  He was recommended to have left partial glossectomy.  Patient decided not to pursue the surgical option.  He reports that he already has a other problems including emphysema and back pain.  He does not want to have a feeding tube placed after surgery and spent time in rehab.  For now he is able to eat and maintain his weight.  He reports pain in his left side of the tongue.  He takes Percocet 5 mg tablet at bedtime.  Occasionally he needs it in the morning also.    REVIEW OF SYSTEMS:  Review of Systems  Constitutional: Negative.   HENT:  Negative.   Respiratory: Negative.   Cardiovascular: Negative.   Gastrointestinal: Negative.   Genitourinary: Negative.    Musculoskeletal: Negative.        Pain in the left side of the tongue.  Skin: Negative.   Neurological: Negative.   Hematological: Negative.   Psychiatric/Behavioral: Negative.      PAST MEDICAL/SURGICAL HISTORY:  Past Medical History:  Diagnosis Date  . Arthritis   . Chronic back  pain   . Chronic neck pain   . GERD (gastroesophageal reflux disease)   . History of gout   . Hypertension    Past Surgical History:  Procedure Laterality Date  . ANTERIOR CRUCIATE LIGAMENT REPAIR Left 1967  . CATARACT EXTRACTION W/PHACO Left 10/02/2013   Procedure: CATARACT EXTRACTION PHACO AND INTRAOCULAR LENS PLACEMENT (IOC);  Surgeon: Elta Guadeloupe T. Gershon Crane, MD;  Location: [facility];  Service: Ophthalmology;  Laterality: Left;  CDE:18.90  . CATARACT EXTRACTION W/PHACO Right 10/16/2013   Procedure: CATARACT EXTRACTION PHACO AND INTRAOCULAR LENS PLACEMENT (IOC);  Surgeon: Elta Guadeloupe T. Gershon Crane, MD;  Location: [facility];  Service: Ophthalmology;  Laterality: Right;  CDE:13.69  . CERVICAL DISC SURGERY    . EYE SURGERY    . LUMBAR DISC SURGERY    . MICROLARYNGOSCOPY Left 06/10/2015   Procedure: MICRODIRECT LARYNGOSCOPY WITH BIOPSY OF THE LEFT VOCAL CORD;  Surgeon: Leta Baptist, MD;  Location: [facility];  Service: ENT;  Laterality: Left;     SOCIAL HISTORY:  Social History   Socioeconomic History  . Marital status: Married    Spouse name: Not on file  . Number of children: Not on file  . Years of education: Not on file  . Highest education level: Not on file  Occupational History  . Not on file  Social Needs  . Financial  resource strain: Not on file  . Food insecurity:    Worry: Not on file    Inability: Not on file  . Transportation needs:    Medical: Not on file    Non-medical: Not on file  Tobacco Use  . Smoking status: Current Every Day Smoker    Packs/day: 2.00    Years: 45.00    Pack years: 90.00    Types: Cigarettes  . Smokeless tobacco: Never Used  Substance and Sexual Activity  . Alcohol use: Yes    Comment:  Socially   . Drug use: No  . Sexual activity: Yes    Birth control/protection: None  Lifestyle  . Physical activity:    Days per week: Not on file    Minutes per session: Not on file  . Stress: Not on file  Relationships  . Social connections:    Talks on  phone: Not on file    Gets together: Not on file    Attends religious service: Not on file    Active member of club or organization: Not on file    Attends meetings of clubs or organizations: Not on file    Relationship status: Not on file  . Intimate partner violence:    Fear of current or ex partner: Not on file    Emotionally abused: Not on file    Physically abused: Not on file    Forced sexual activity: Not on file  Other Topics Concern  . Not on file  Social History Narrative  . Not on file    FAMILY HISTORY:  History reviewed. No pertinent family history.  CURRENT MEDICATIONS:  Outpatient Encounter Medications as of 04/14/2018  Medication Sig Note  . naproxen sodium (ALEVE) 220 MG tablet Take 220 mg by mouth.   . oxyCODONE-acetaminophen (PERCOCET/ROXICET) 5-325 MG tablet Take by mouth every 4 (four) hours as needed for severe pain.   Marland Kitchen acetaminophen (TYLENOL) 500 MG tablet Take 100 mg by mouth every 6 (six) hours as needed for mild pain.   Marland Kitchen amLODipine (NORVASC) 5 MG tablet Take 5 mg by mouth daily.   Marland Kitchen aspirin EC 81 MG tablet Take 81 mg by mouth daily.   . cyclobenzaprine (FLEXERIL) 10 MG tablet Take 10 mg by mouth as needed for muscle spasms.   . diclofenac (VOLTAREN) 75 MG EC tablet Take 75 mg by mouth daily as needed for mild pain.   Marland Kitchen losartan (COZAAR) 50 MG tablet  06/20/2015: Received from: External Pharmacy   No facility-administered encounter medications on file as of 04/14/2018.     ALLERGIES:  No Known Allergies   PHYSICAL EXAM:  ECOG Performance status: 1  Vitals:   04/14/18 1532  BP: (!) 148/82  Pulse: 97  Resp: 18  SpO2: 100%   Filed Weights   04/14/18 1532  Weight: 165 lb 4.8 oz (75 kg)    Physical Exam  HEENT: Tumor nodule on the left lateral tongue present.  No ulceration.  No palpable lymphadenopathy in the neck.  LABORATORY DATA:  I have reviewed the labs as listed.  CBC    Component Value Date/Time   WBC 7.7 06/13/2017 1239   RBC  4.01 (L) 06/13/2017 1239   HGB 14.4 06/13/2017 1239   HCT 39.7 06/13/2017 1239   PLT 262 06/13/2017 1239   MCV 99.0 06/13/2017 1239   MCH 35.9 (H) 06/13/2017 1239   MCHC 36.3 (H) 06/13/2017 1239   RDW 13.0 06/13/2017 1239   LYMPHSABS 1.8 06/13/2017 1239  MONOABS 0.7 06/13/2017 1239   EOSABS 0.1 06/13/2017 1239   BASOSABS 0.0 06/13/2017 1239   CMP Latest Ref Rng & Units 12/20/2017 06/13/2017 11/05/2016  Glucose 65 - 99 mg/dL - 77 92  BUN 6 - 20 mg/dL - 8 10  Creatinine 0.61 - 1.24 mg/dL 0.90 0.72 0.70  Sodium 135 - 145 mmol/L - 125(L) 128(L)  Potassium 3.5 - 5.1 mmol/L - 3.9 4.2  Chloride 101 - 111 mmol/L - 91(L) 95(L)  CO2 22 - 32 mmol/L - 26 26  Calcium 8.9 - 10.3 mg/dL - 8.8(L) 8.7(L)  Total Protein 6.5 - 8.1 g/dL - 7.2 7.2  Total Bilirubin 0.3 - 1.2 mg/dL - 0.7 0.7  Alkaline Phos 38 - 126 U/L - 74 68  AST 15 - 41 U/L - 20 19  ALT 17 - 63 U/L - 17 15(L)       DIAGNOSTIC IMAGING:  I have independently reviewed the images of the CT scan of the chest dated 04/13/2018 and discussed with the patient.     ASSESSMENT & PLAN:   Tongue cancer ([place]) 1.  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 Conley Canal at [facility], recommended to have a PET/CT scan.  As patient could not lie on his back for a long time, a CT scan of the neck and chest was done.  He did not have any metastatic disease. - He was seen by Dr. Conley Canal on 01/27/2018.  He was recommended to have a left partial glossectomy, bilateral selective neck dissection and left radial forearm free flap reconstruction.  He would also need a feeding tube placement at that time as he will be unable to eat by mouth for 3 to 4 weeks. - Kevin Durham has decided to not to undergo surgery.  He reports that there are too many things going on in his life.  He does not want to go through the  trouble of surgery, feeding tube and rehab.  He is reportedly already making plans to find help for his wife when he is gone.  He clearly understands the consequences of his decision. -He is having pain in his tongue for which he is taking Percocet 5 mg 1 to 2 tablets/day.  Dr. Willey Blade is managing his pain. - I have reviewed the results of the CT of the chest dated 04/13/2018 which was done for follow-up of lung nodule.  Right lower lobe lung nodule was stable.  New subpleural 4 mm left lower lobe lung nodule was seen. - We will give him a follow-up visit in 3 months to see how he is doing and if he needs any help with palliative care.  He is agreeable to this.  2.  Stage I (cT1 N0 M0) laryngeal cancer: -Status post 6300 cGy, definitive radiation from 07/14/2015 through 08/22/2015 -No evidence of adenopathy on recent CT scan.  He will undergo periodic direct laryngeal examination by Dr. Benjamine Mola.  His last TSH was in September and was normal.       Orders placed this encounter:  No orders of the defined types were placed in this encounter.     Derek Jack, MD [place] [phone]

## 2018-04-17 DIAGNOSIS — C029 Malignant neoplasm of tongue, unspecified: Secondary | ICD-10-CM | POA: Diagnosis not present

## 2018-05-25 DIAGNOSIS — C029 Malignant neoplasm of tongue, unspecified: Secondary | ICD-10-CM | POA: Diagnosis not present

## 2018-06-22 DIAGNOSIS — Z23 Encounter for immunization: Secondary | ICD-10-CM | POA: Diagnosis not present

## 2018-06-22 DIAGNOSIS — C029 Malignant neoplasm of tongue, unspecified: Secondary | ICD-10-CM | POA: Diagnosis not present

## 2018-07-11 DIAGNOSIS — C029 Malignant neoplasm of tongue, unspecified: Secondary | ICD-10-CM | POA: Diagnosis not present

## 2018-07-17 ENCOUNTER — Ambulatory Visit (HOSPITAL_COMMUNITY): Payer: Medicare Other | Admitting: Hematology

## 2018-08-08 DIAGNOSIS — C029 Malignant neoplasm of tongue, unspecified: Secondary | ICD-10-CM | POA: Diagnosis not present

## 2018-08-14 ENCOUNTER — Emergency Department (HOSPITAL_COMMUNITY)
Admission: EM | Admit: 2018-08-14 | Discharge: 2018-08-14 | Disposition: A | Discharge status: Home / Self Care | Payer: Medicare Other

## 2018-08-15 DIAGNOSIS — K5909 Other constipation: Secondary | ICD-10-CM | POA: Diagnosis not present

## 2018-08-31 DIAGNOSIS — C029 Malignant neoplasm of tongue, unspecified: Secondary | ICD-10-CM | POA: Diagnosis not present

## 2018-09-01 DIAGNOSIS — C029 Malignant neoplasm of tongue, unspecified: Secondary | ICD-10-CM | POA: Diagnosis not present

## 2018-09-01 DIAGNOSIS — J449 Chronic obstructive pulmonary disease, unspecified: Secondary | ICD-10-CM | POA: Diagnosis not present

## 2018-09-01 DIAGNOSIS — I10 Essential (primary) hypertension: Secondary | ICD-10-CM | POA: Diagnosis not present

## 2018-09-01 DIAGNOSIS — I700 Atherosclerosis of aorta: Secondary | ICD-10-CM | POA: Diagnosis not present

## 2018-09-15 ENCOUNTER — Encounter (HOSPITAL_COMMUNITY): Payer: Self-pay | Admitting: Emergency Medicine

## 2018-09-15 ENCOUNTER — Inpatient Hospital Stay (HOSPITAL_COMMUNITY)
Admission: EM | Admit: 2018-09-15 | Discharge: 2018-09-19 | DRG: 147 | Disposition: A | Discharge status: Hospice | Attending: Internal Medicine | Admitting: Internal Medicine

## 2018-09-15 ENCOUNTER — Other Ambulatory Visit: Payer: Self-pay

## 2018-09-15 DIAGNOSIS — R1084 Generalized abdominal pain: Secondary | ICD-10-CM | POA: Diagnosis not present

## 2018-09-15 DIAGNOSIS — W19XXXA Unspecified fall, initial encounter: Secondary | ICD-10-CM | POA: Diagnosis not present

## 2018-09-15 DIAGNOSIS — I10 Essential (primary) hypertension: Secondary | ICD-10-CM | POA: Diagnosis not present

## 2018-09-15 DIAGNOSIS — R627 Adult failure to thrive: Secondary | ICD-10-CM | POA: Diagnosis present

## 2018-09-15 DIAGNOSIS — T402X5A Adverse effect of other opioids, initial encounter: Secondary | ICD-10-CM | POA: Diagnosis not present

## 2018-09-15 DIAGNOSIS — R296 Repeated falls: Secondary | ICD-10-CM | POA: Diagnosis not present

## 2018-09-15 DIAGNOSIS — R6251 Failure to thrive (child): Secondary | ICD-10-CM | POA: Diagnosis present

## 2018-09-15 DIAGNOSIS — C069 Malignant neoplasm of mouth, unspecified: Secondary | ICD-10-CM | POA: Diagnosis not present

## 2018-09-15 DIAGNOSIS — Z79899 Other long term (current) drug therapy: Secondary | ICD-10-CM | POA: Diagnosis not present

## 2018-09-15 DIAGNOSIS — M549 Dorsalgia, unspecified: Secondary | ICD-10-CM | POA: Diagnosis present

## 2018-09-15 DIAGNOSIS — C320 Malignant neoplasm of glottis: Secondary | ICD-10-CM | POA: Diagnosis present

## 2018-09-15 DIAGNOSIS — Z515 Encounter for palliative care: Secondary | ICD-10-CM | POA: Diagnosis not present

## 2018-09-15 DIAGNOSIS — M542 Cervicalgia: Secondary | ICD-10-CM | POA: Diagnosis present

## 2018-09-15 DIAGNOSIS — C029 Malignant neoplasm of tongue, unspecified: Secondary | ICD-10-CM | POA: Diagnosis not present

## 2018-09-15 DIAGNOSIS — Z720 Tobacco use: Secondary | ICD-10-CM | POA: Diagnosis not present

## 2018-09-15 DIAGNOSIS — R443 Hallucinations, unspecified: Secondary | ICD-10-CM | POA: Diagnosis present

## 2018-09-15 DIAGNOSIS — Z681 Body mass index (BMI) 19 or less, adult: Secondary | ICD-10-CM

## 2018-09-15 DIAGNOSIS — Z7189 Other specified counseling: Secondary | ICD-10-CM | POA: Diagnosis not present

## 2018-09-15 DIAGNOSIS — R64 Cachexia: Secondary | ICD-10-CM | POA: Diagnosis not present

## 2018-09-15 DIAGNOSIS — G8929 Other chronic pain: Secondary | ICD-10-CM | POA: Diagnosis not present

## 2018-09-15 DIAGNOSIS — Z66 Do not resuscitate: Secondary | ICD-10-CM | POA: Diagnosis present

## 2018-09-15 DIAGNOSIS — R5381 Other malaise: Secondary | ICD-10-CM | POA: Diagnosis not present

## 2018-09-15 DIAGNOSIS — K5903 Drug induced constipation: Secondary | ICD-10-CM | POA: Diagnosis not present

## 2018-09-15 DIAGNOSIS — Y92009 Unspecified place in unspecified non-institutional (private) residence as the place of occurrence of the external cause: Secondary | ICD-10-CM

## 2018-09-15 DIAGNOSIS — T1490XA Injury, unspecified, initial encounter: Secondary | ICD-10-CM | POA: Diagnosis not present

## 2018-09-15 DIAGNOSIS — F17210 Nicotine dependence, cigarettes, uncomplicated: Secondary | ICD-10-CM | POA: Diagnosis present

## 2018-09-15 HISTORY — DX: Malignant neoplasm of tongue, unspecified: C02.9

## 2018-09-15 HISTORY — DX: Encounter for palliative care: Z51.5

## 2018-09-15 MED ORDER — DIPHENHYDRAMINE HCL 12.5 MG/5ML PO ELIX: 12.5000 mg | ORAL_SOLUTION | Freq: Four times a day (QID) | ORAL | Status: DC | PRN

## 2018-09-15 MED ORDER — HYDROMORPHONE HCL 2 MG/ML IJ SOLN: 1.5000 mg | Freq: Once | INTRAMUSCULAR | Status: DC

## 2018-09-15 MED ORDER — LORAZEPAM 2 MG/ML IJ SOLN: 1.0000 mg | INTRAMUSCULAR | Status: DC | PRN

## 2018-09-15 MED ORDER — SODIUM CHLORIDE 0.9% FLUSH: 9.0000 mL | INTRAVENOUS | Status: DC | PRN

## 2018-09-15 MED ORDER — BIOTENE DRY MOUTH MT LIQD
15.0000 mL | OROMUCOSAL | Status: DC | PRN
Start: 2018-09-15 — End: 2018-09-17

## 2018-09-15 MED ORDER — HYDROMORPHONE 1 MG/ML IV SOLN
INTRAVENOUS | Status: DC
  Filled 2018-09-15 (×2): qty 25

## 2018-09-15 MED ORDER — LORAZEPAM 1 MG PO TABS: 1.0000 mg | ORAL_TABLET | ORAL | Status: DC | PRN

## 2018-09-15 MED ORDER — NICOTINE 21 MG/24HR TD PT24
21.0000 mg | MEDICATED_PATCH | Freq: Every day | TRANSDERMAL | Status: DC
  Administered 2018-09-15 – 2018-09-19 (×5): 21 mg via TRANSDERMAL
  Filled 2018-09-15 (×5): qty 1

## 2018-09-15 MED ORDER — ONDANSETRON HCL 4 MG/2ML IJ SOLN: 4.0000 mg | Freq: Four times a day (QID) | INTRAMUSCULAR | Status: DC | PRN

## 2018-09-15 MED ORDER — NALOXONE HCL 0.4 MG/ML IJ SOLN: 0.4000 mg | INTRAMUSCULAR | Status: DC | PRN

## 2018-09-15 MED ORDER — DIPHENHYDRAMINE HCL 50 MG/ML IJ SOLN: 12.5000 mg | Freq: Four times a day (QID) | INTRAMUSCULAR | Status: DC | PRN

## 2018-09-15 MED ORDER — HYDROMORPHONE HCL 1 MG/ML IJ SOLN
0.5000 mg | Freq: Once | INTRAMUSCULAR | Status: AC
  Administered 2018-09-15: 0.5 mg via INTRAVENOUS
  Filled 2018-09-15: qty 1

## 2018-09-15 MED ORDER — LORAZEPAM 2 MG/ML PO CONC
1.0000 mg | ORAL | Status: DC | PRN
  Filled 2018-09-15: qty 0.5

## 2018-09-15 MED ORDER — HYDROMORPHONE HCL 1 MG/ML IJ SOLN
1.0000 mg | INTRAMUSCULAR | Status: DC | PRN
  Administered 2018-09-16 – 2018-09-17 (×11): 1 mg via INTRAVENOUS
  Filled 2018-09-15 (×11): qty 1

## 2018-09-15 MED ORDER — SODIUM CHLORIDE 0.9 % IV SOLN
INTRAVENOUS | Status: DC
  Administered 2018-09-16 – 2018-09-18 (×7): via INTRAVENOUS
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
## 2018-09-15 NOTE — ED Triage Notes (Signed)
PT brought in by [facility]. Pt's wife called EMS and stated pt had fell in the bathroom. PT states he doesn't remember falling. PT has skin tear noted to right elbow. PT under hospice care for dx of tongue cancer and wears a PCA pump of Hydromorphone 3mg  per hour and 1.5mg  with PCA button. PT has it turned off at this time.

## 2018-09-15 NOTE — H&P (Addendum)
History and Physical    Kevin Durham VZD:[id number] DOB: 18-May-1946 DOA: 09/15/2018  PCP: [name], MD Patient coming from: Home  Chief Complaint: Failure to thrive  HPI: Kevin Durham is a 72 y.o. male with medical history significant of squamous cell carcinoma of the tongue, stage I laryngeal cancer, chronic pain, hypertension presenting to the hospital for evaluation of failure to thrive.  Wife states that patient has metastatic tongue cancer.  He had previously declined trach opening and feeding tube in March.  Patient has significantly declined over the past 3 weeks.  He has been on home hospice for 3 weeks and is currently on a Dilaudid PCA pump.  Wife states he has not been eating anything for the past 3 days.  Not even able to swallow liquids.  He has been hallucinating, picking at things, and not sleeping.  He has had 2 falls at home over the past 1 week.  Patient is complaining of pain on the right side of his abdomen which started in the emergency room. I had a goals of care discussion with the patient and his wife.  They want comfort care measures only.  They do not want any labs or imaging done.  They are okay with IV pain medication and IV hydration.  Wife understands that patient is at high risk for choking but would like to continue his liquid diet to keep him comfortable in the setting of his terminal illness.  States she spoke to [place] who is a Retail banker and was informed there is no vacancy at inpatient hospice.  As such, they were advised to come into the hospital.  Wife is requesting for the patient to go to inpatient hospice.  Patient is in agreement with his wife and states he just wants to be comfortable and does not want any testing done.  He is requesting medication for his pain.  ED Course: No testing done and no medications administered.  Dilaudid PCA pump was switched off by EMS.  Review of Systems: As per HPI otherwise 10 point review of systems  negative.  Past Medical History:  Diagnosis Date  . Arthritis   . Chronic back pain   . Chronic neck pain   . GERD (gastroesophageal reflux disease)   . History of gout   . Hospice care patient   . Hypertension   . Tongue cancer [facility])     Past Surgical History:  Procedure Laterality Date  . ANTERIOR CRUCIATE LIGAMENT REPAIR Left 1967  . CATARACT EXTRACTION W/PHACO Left 10/02/2013   Procedure: CATARACT EXTRACTION PHACO AND INTRAOCULAR LENS PLACEMENT (IOC);  Surgeon: Elta Guadeloupe T. Gershon Crane, MD;  Location: [facility];  Service: Ophthalmology;  Laterality: Left;  CDE:18.90  . CATARACT EXTRACTION W/PHACO Right 10/16/2013   Procedure: CATARACT EXTRACTION PHACO AND INTRAOCULAR LENS PLACEMENT (IOC);  Surgeon: Elta Guadeloupe T. Gershon Crane, MD;  Location: [facility];  Service: Ophthalmology;  Laterality: Right;  CDE:13.69  . CERVICAL DISC SURGERY    . EYE SURGERY    . LUMBAR DISC SURGERY    . MICROLARYNGOSCOPY Left 06/10/2015   Procedure: MICRODIRECT LARYNGOSCOPY WITH BIOPSY OF THE LEFT VOCAL CORD;  Surgeon: Leta Baptist, MD;  Location: [place];  Service: ENT;  Laterality: Left;     reports that he has been smoking cigarettes. He has a 90.00 pack-year smoking history. He has never used smokeless tobacco. He reports current alcohol use. He reports that he does not use drugs.  No Known Allergies  History reviewed.  No pertinent family history.  Prior to Admission medications   Medication Sig Start Date End Date Taking? Authorizing Provider  lidocaine (XYLOCAINE) 2 % solution Use as directed 5 mLs in the mouth or throat 4 (four) times daily as needed for mouth pain.   Yes [provider]  lubiprostone (AMITIZA) 24 MCG capsule Take 24 mcg by mouth 2 (two) times daily with a meal.   Yes [provider]  olmesartan (BENICAR) 20 MG tablet Take 20 mg by mouth daily.   Yes [provider]  oxyCODONE HCl 10 MG/0.5ML CONC Take 3 mLs by mouth every 2 (two) hours as needed.   Yes [provider]  torsemide (DEMADEX) 20 MG tablet Take 20 mg by mouth daily.   Yes [provider]    Physical Exam: Vitals:   09/15/18 1800 09/15/18 1830 09/15/18 2030 09/15/18 2100  BP: (!) 145/81 133/83 129/63 (!) 142/68  Pulse: 78 83 85 90  Resp: (!) 23 (!) 22 20 20   Temp:      TempSrc:      SpO2: 93% 93% 94% 95%  Weight:      Height:        Physical Exam  Constitutional: He is oriented to person, place, and time.  Pale, extremely weak and cachectic  HENT:  Head: Normocephalic.  Eyes: Right eye exhibits no discharge. Left eye exhibits no discharge.  Neck: Neck supple.  Cardiovascular: Normal rate, regular rhythm and intact distal pulses.  Pulmonary/Chest: Effort normal and breath sounds normal. No respiratory distress. He has no wheezes. He has no rales.  Abdominal: Bowel sounds are normal. There is abdominal tenderness. There is guarding.  Abdomen tender to even minimal palpation  Musculoskeletal:        General: Edema present.     Comments: +1 pitting edema of bilateral lower extremities  Neurological: He is alert and oriented to person, place, and time.  Skin: Skin is warm and dry.  Psychiatric: Thought content normal.     Labs on Admission: I have personally reviewed following labs and imaging studies  CBC: No results for input(s): WBC, NEUTROABS, HGB, HCT, MCV, PLT in the last 168 hours. Basic Metabolic Panel: No results for input(s): NA, K, CL, CO2, GLUCOSE, BUN, CREATININE, CALCIUM, MG, PHOS in the last 168 hours. GFR: CrCl cannot be calculated (Patient's most recent lab result is older than the maximum 21 days allowed.). Liver Function Tests: No results for input(s): AST, ALT, ALKPHOS, BILITOT, PROT, ALBUMIN in the last 168 hours. No results for input(s): LIPASE, AMYLASE in the last 168 hours. No results for input(s): AMMONIA in the last 168 hours. Coagulation Profile: No results for input(s): INR, PROTIME in the last 168 hours. Cardiac  Enzymes: No results for input(s): CKTOTAL, CKMB, CKMBINDEX, TROPONINI in the last 168 hours. BNP (last 3 results) No results for input(s): PROBNP in the last 8760 hours. HbA1C: No results for input(s): HGBA1C in the last 72 hours. CBG: No results for input(s): GLUCAP in the last 168 hours. Lipid Profile: No results for input(s): CHOL, HDL, LDLCALC, TRIG, CHOLHDL, LDLDIRECT in the last 72 hours. Thyroid Function Tests: No results for input(s): TSH, T4TOTAL, FREET4, T3FREE, THYROIDAB in the last 72 hours. Anemia Panel: No results for input(s): VITAMINB12, FOLATE, FERRITIN, TIBC, IRON, RETICCTPCT in the last 72 hours. Urine analysis: No results found for: COLORURINE, APPEARANCEUR, LABSPEC, PHURINE, GLUCOSEU, HGBUR, BILIRUBINUR, KETONESUR, PROTEINUR, UROBILINOGEN, NITRITE, LEUKOCYTESUR  Radiological Exams on Admission: No results found.  EKG: Independently reviewed.  Sinus rhythm (heart rate 74), artifact.  Assessment/Plan Principal Problem:   Failure to thrive in adult Active Problems:   Carcinoma of true vocal cord (HCC)   Tobacco abuse   Tongue cancer (HCC)   Failure to thrive in the setting of terminal illness Patient is extremely weak and cachectic.  History of squamous cell carcinoma of the tongue and laryngeal cancer.  He has previously declined surgery and feeding tube placement.  He has been on home hospice for the past 3 weeks.  He is on a Dilaudid PCA pump at home. Unable to tolerate any p.o. intake for the past 3 days.  Complaining of abdominal pain. I had a goals of care discussion with the patient and his wife.  They want comfort care measures only.  They do not want any labs or imaging done.  They are okay with IV pain medication and IV hydration.  Wife understands that patient is at high risk for choking but would like to continue his liquid diet to keep him comfortable in the setting of his terminal illness.  Patient is in agreement with his wife and states he just wants  to be comfortable and does not want any testing done. -IV Dilaudid PCA pump -Ativan PRN anxiety -Zofran PRN -Benadryl PRN -IV fluid hydration -Full liquid diet for comfort -Social work consult.  Patient will need arrangements for inpatient hospice placement.  Squamous cell carcinoma of the tongue Last seen by Dr. Delton Coombes in July 2019.  Per review of his documentation: Patient was diagnosed per biopsy in February 2019.  He was seen by Dr. Elisabeth Cara at [facility].  In May 2019, Dr. Elisabeth Cara had recommended a left partial glossectomy, bilateral selective neck dissection, and left radial forearm free flap reconstruction.  He also recommended a feeding tube placement at that time. Patient had decided not to undergo surgery.    Laryngeal cancer Last seen by Dr. Delton Coombes in July 2019.  Per review of his documentation: Patient underwent radiation from October 2016 through December 2016.   Tobacco use Patient is currently smoking 2 to 3 packs/day. -NicoDerm patch  DVT prophylaxis: None.  Patient is comfort care. Code Status: DNR.  Signed form present at bedside. Family Communication: Wife at bedside. Disposition Plan: Discharge to inpatient hospice. Consults called: None Admission status: Observation   Shela Leff MD [facility] Pager [phone]  If 7PM-7AM, please contact night-coverage [email or web address] Password [facility]  09/15/2018, 9:41 PM

## 2018-09-16 DIAGNOSIS — Z79899 Other long term (current) drug therapy: Secondary | ICD-10-CM | POA: Diagnosis not present

## 2018-09-16 DIAGNOSIS — R1084 Generalized abdominal pain: Secondary | ICD-10-CM

## 2018-09-16 DIAGNOSIS — W19XXXA Unspecified fall, initial encounter: Secondary | ICD-10-CM | POA: Diagnosis present

## 2018-09-16 DIAGNOSIS — K5903 Drug induced constipation: Secondary | ICD-10-CM | POA: Diagnosis present

## 2018-09-16 DIAGNOSIS — M542 Cervicalgia: Secondary | ICD-10-CM | POA: Diagnosis present

## 2018-09-16 DIAGNOSIS — M549 Dorsalgia, unspecified: Secondary | ICD-10-CM | POA: Diagnosis present

## 2018-09-16 DIAGNOSIS — R296 Repeated falls: Secondary | ICD-10-CM | POA: Diagnosis present

## 2018-09-16 DIAGNOSIS — R6251 Failure to thrive (child): Secondary | ICD-10-CM | POA: Diagnosis present

## 2018-09-16 DIAGNOSIS — R64 Cachexia: Secondary | ICD-10-CM | POA: Diagnosis present

## 2018-09-16 DIAGNOSIS — Z7189 Other specified counseling: Secondary | ICD-10-CM | POA: Diagnosis not present

## 2018-09-16 DIAGNOSIS — Z681 Body mass index (BMI) 19 or less, adult: Secondary | ICD-10-CM | POA: Diagnosis not present

## 2018-09-16 DIAGNOSIS — I10 Essential (primary) hypertension: Secondary | ICD-10-CM | POA: Diagnosis present

## 2018-09-16 DIAGNOSIS — C320 Malignant neoplasm of glottis: Secondary | ICD-10-CM | POA: Diagnosis not present

## 2018-09-16 DIAGNOSIS — T402X5A Adverse effect of other opioids, initial encounter: Secondary | ICD-10-CM | POA: Diagnosis present

## 2018-09-16 DIAGNOSIS — R627 Adult failure to thrive: Secondary | ICD-10-CM | POA: Diagnosis not present

## 2018-09-16 DIAGNOSIS — C069 Malignant neoplasm of mouth, unspecified: Secondary | ICD-10-CM | POA: Diagnosis not present

## 2018-09-16 DIAGNOSIS — Y92009 Unspecified place in unspecified non-institutional (private) residence as the place of occurrence of the external cause: Secondary | ICD-10-CM | POA: Diagnosis not present

## 2018-09-16 DIAGNOSIS — Z720 Tobacco use: Secondary | ICD-10-CM | POA: Diagnosis not present

## 2018-09-16 DIAGNOSIS — C029 Malignant neoplasm of tongue, unspecified: Secondary | ICD-10-CM | POA: Diagnosis not present

## 2018-09-16 DIAGNOSIS — G8929 Other chronic pain: Secondary | ICD-10-CM | POA: Diagnosis present

## 2018-09-16 DIAGNOSIS — F17210 Nicotine dependence, cigarettes, uncomplicated: Secondary | ICD-10-CM | POA: Diagnosis present

## 2018-09-16 DIAGNOSIS — R443 Hallucinations, unspecified: Secondary | ICD-10-CM | POA: Diagnosis present

## 2018-09-16 DIAGNOSIS — Z66 Do not resuscitate: Secondary | ICD-10-CM | POA: Diagnosis not present

## 2018-09-16 DIAGNOSIS — Z515 Encounter for palliative care: Secondary | ICD-10-CM | POA: Diagnosis not present

## 2018-09-16 MED ORDER — TEMAZEPAM 7.5 MG PO CAPS: 7.5000 mg | ORAL_CAPSULE | Freq: Every evening | ORAL | Status: DC | PRN

## 2018-09-16 MED ORDER — ALBUTEROL SULFATE (2.5 MG/3ML) 0.083% IN NEBU: 2.5000 mg | INHALATION_SOLUTION | RESPIRATORY_TRACT | Status: DC | PRN

## 2018-09-16 MED ORDER — GLYCOPYRROLATE 0.2 MG/ML IJ SOLN: 0.2000 mg | INTRAMUSCULAR | Status: DC | PRN

## 2018-09-16 MED ORDER — HALOPERIDOL LACTATE 2 MG/ML PO CONC: 0.5000 mg | ORAL | Status: DC | PRN

## 2018-09-16 MED ORDER — LORAZEPAM 1 MG PO TABS: 1.0000 mg | ORAL_TABLET | ORAL | Status: DC | PRN

## 2018-09-16 MED ORDER — ALUM & MAG HYDROXIDE-SIMETH 200-200-20 MG/5ML PO SUSP: 30.0000 mL | Freq: Four times a day (QID) | ORAL | Status: DC | PRN

## 2018-09-16 MED ORDER — DIPHENHYDRAMINE HCL 50 MG/ML IJ SOLN: 12.5000 mg | INTRAMUSCULAR | Status: DC | PRN

## 2018-09-16 MED ORDER — GLYCOPYRROLATE 1 MG PO TABS: 1.0000 mg | ORAL_TABLET | ORAL | Status: DC | PRN

## 2018-09-16 MED ORDER — LIDOCAINE VISCOUS HCL 2 % MT SOLN: 15.0000 mL | Freq: Four times a day (QID) | OROMUCOSAL | Status: DC | PRN

## 2018-09-16 MED ORDER — MORPHINE SULFATE (CONCENTRATE) 10 MG/0.5ML PO SOLN: 5.0000 mg | ORAL | Status: DC | PRN

## 2018-09-16 MED ORDER — ACETAMINOPHEN 325 MG PO TABS: 650.0000 mg | ORAL_TABLET | Freq: Four times a day (QID) | ORAL | Status: DC | PRN

## 2018-09-16 MED ORDER — HALOPERIDOL 0.5 MG PO TABS: 0.5000 mg | ORAL_TABLET | ORAL | Status: DC | PRN

## 2018-09-16 MED ORDER — BIOTENE DRY MOUTH MT LIQD: 15.0000 mL | OROMUCOSAL | Status: DC | PRN

## 2018-09-16 MED ORDER — BISACODYL 10 MG RE SUPP
10.0000 mg | Freq: Every day | RECTAL | Status: DC
  Administered 2018-09-16: 10 mg via RECTAL
  Filled 2018-09-16 (×2): qty 1

## 2018-09-16 MED ORDER — MAGIC MOUTHWASH: 10.0000 mL | Freq: Four times a day (QID) | ORAL | Status: DC | PRN

## 2018-09-16 MED ORDER — HALOPERIDOL LACTATE 5 MG/ML IJ SOLN: 0.5000 mg | INTRAMUSCULAR | Status: DC | PRN

## 2018-09-16 MED ORDER — MAGIC MOUTHWASH W/LIDOCAINE: 15.0000 mL | Freq: Four times a day (QID) | ORAL | Status: DC | PRN

## 2018-09-16 MED ORDER — ACETAMINOPHEN 650 MG RE SUPP: 650.0000 mg | Freq: Four times a day (QID) | RECTAL | Status: DC | PRN

## 2018-09-16 MED ORDER — LORAZEPAM 2 MG/ML IJ SOLN: 1.0000 mg | INTRAMUSCULAR | Status: DC | PRN

## 2018-09-16 MED ORDER — SODIUM CHLORIDE 0.9 % IV SOLN: 12.5000 mg | Freq: Four times a day (QID) | INTRAVENOUS | Status: DC | PRN

## 2018-09-16 MED ORDER — NYSTATIN 100000 UNIT/GM EX POWD: Freq: Three times a day (TID) | CUTANEOUS | Status: DC | PRN

## 2018-09-16 MED ORDER — ONDANSETRON 4 MG PO TBDP: 4.0000 mg | ORAL_TABLET | Freq: Four times a day (QID) | ORAL | Status: DC | PRN

## 2018-09-16 MED ORDER — ONDANSETRON HCL 4 MG/2ML IJ SOLN: 4.0000 mg | Freq: Four times a day (QID) | INTRAMUSCULAR | Status: DC | PRN

## 2018-09-16 MED ORDER — LORAZEPAM 2 MG/ML PO CONC: 1.0000 mg | ORAL | Status: DC | PRN

## 2018-09-16 MED ORDER — BISACODYL 10 MG RE SUPP: 10.0000 mg | Freq: Every day | RECTAL | Status: DC | PRN

## 2018-09-16 MED ORDER — MORPHINE SULFATE (PF) 2 MG/ML IV SOLN: 1.0000 mg | INTRAVENOUS | Status: DC | PRN

## 2018-09-16 MED ORDER — POLYVINYL ALCOHOL 1.4 % OP SOLN: 1.0000 [drp] | Freq: Four times a day (QID) | OPHTHALMIC | Status: DC | PRN

## 2018-09-16 NOTE — Progress Notes (Signed)
Sop suds enema was attempted and patient did not tolerate well. Patient was screaming and agonizing in pain. Patient refused to continue with the enema. Patient is however having some results from suppository given earlier in the shift and is receptive to continue receiving the suppository.

## 2018-09-16 NOTE — Progress Notes (Signed)
PROGRESS NOTE    Kevin Durham  PPJ:[id number] DOB: 12/17/1945 DOA: 09/15/2018 PCP: [name], MD   Brief Narrative:  Per Dr Doyce Loose is a 72 y.o. male with medical history significant of squamous cell carcinoma of the tongue, stage I laryngeal cancer, chronic pain, hypertension presenting to the hospital for evaluation of failure to thrive.  Wife states that patient has metastatic tongue cancer.  He had previously declined trach opening and feeding tube in March.  Patient has significantly declined over the past 3 weeks.  He has been on home hospice for 3 weeks and is currently on a Dilaudid PCA pump.  Wife states he has not been eating anything for the past 3 days.  Not even able to swallow liquids.  He has been hallucinating, picking at things, and not sleeping.  He has had 2 falls at home over the past 1 week.  Patient is complaining of pain on the right side of his abdomen which started in the emergency room. I had a goals of care discussion with the patient and his wife.  They want comfort care measures only.  They do not want any labs or imaging done.  They are okay with IV pain medication and IV hydration.  Wife understands that patient is at high risk for choking but would like to continue his liquid diet to keep him comfortable in the setting of his terminal illness.  States she spoke to [place] who is a Retail banker and was informed there is no vacancy at inpatient hospice.  As such, they were advised to come into the hospital.  Wife is requesting for the patient to go to inpatient hospice.  Patient is in agreement with his wife and states he just wants to be comfortable and does not want any testing done.  He is requesting medication for his pain.  ED Course: No testing done and no medications administered.  Dilaudid    Assessment & Plan:   Principal Problem:   Failure to thrive in adult Active Problems:   Carcinoma of true vocal cord (HCC)   Tobacco abuse   Tongue cancer (HCC)   Failure to thrive (0-17)  #1 failure to thrive in the setting of terminal illness Patient presenting with extreme weakness and cachexia.  With history of squamous cell carcinoma of the tongue and laryngeal cancer.  Patient declined surgery and feeding tube placement in the past.  Patient had been at home with hospice over the past 3 weeks on a Dilaudid PCA pump at home however unable to tolerate any oral intake x3 days.  Patient with complaints of abdominal pain.  Dr. Marlowe Sax had a goals of care discussion with patient and wife and decision was made for comfort care measures only.  Family does not want any labs or imaging done.  Family is fine with IV pain medication IV hydration.  Patient to be maintained on a full liquid diet to keep him comfortable in the setting of terminal illness.  Continue full liquid diet for comfort.  Dulcolax suppositories daily.  IV Thorazine as needed.  Place on the end-of-life order set.  Consulted palliative care for symptom management.  Social work consult for residential hospice home placement.  2.  Abdominal pain Concern for constipation as patient on a Dilaudid PCA pump.  We will give a Dulcolax suppository.  Soapsuds enema x1.  Follow.  3.  Squamous cell carcinoma of the tongue/laryngeal cancer Last seen by Dr. Delton Coombes in July 2019  of oncology.  Patient diagnosed per biopsy February 2019 seen by Dr. Elisabeth Cara of [facility].  In May 2019 Dr. Elisabeth Cara had recommended a left partial glossectomy, bilateral selective neck dissection and left radial forearm free flap reconstruction.  Feeding tube placement was also recommended at that time but patient decided not to undergo surgery.  Patient now on comfort measures.  4.  Tobacco use Patient with ongoing tobacco use 2 to 3 packs/day.  Continue nicotine patch.   DVT prophylaxis: Comfort care Code Status: DNR Family Communication: Updated patient.  No family at bedside. Disposition  Plan: Needs residential hospice home.   Consultants:   None  Procedures:   None  Antimicrobials:   None   Subjective: Sitting up in bed watching television.  Denies any change in chronic shortness of breath.  Denies any chest pain.  States can remember the last time he had a bowel movement.  No nausea or emesis.  Objective: Vitals:   09/15/18 2030 09/15/18 2100 09/15/18 2212 09/16/18 0609  BP: 129/63 (!) 142/68 (!) 136/91 (!) 150/85  Pulse: 85 90 (!) 101 85  Resp: 20 20 20    Temp:   (!) 97.5 F (36.4 C) 98.2 F (36.8 C)  TempSrc:   Oral Oral  SpO2: 94% 95% 95% 95%  Weight:   54.5 kg   Height:   6\' 4"  (1.93 m)     Intake/Output Summary (Last 24 hours) at 09/16/2018 1128 Last data filed at 09/16/2018 0930 Gross per 24 hour  Intake 593.51 ml  Output 250 ml  Net 343.51 ml   Filed Weights   09/15/18 1732 09/15/18 2212  Weight: 62.6 kg 54.5 kg    Examination:  General exam: Appears calm and comfortable.  Cachectic.  Chronically ill-appearing. Respiratory system: Clear to auscultation. Respiratory effort normal. Cardiovascular system: S1 & S2 heard, RRR. No JVD, murmurs, rubs, gallops or clicks. No pedal edema. Gastrointestinal system: Abdomen is nondistended, soft and nontender. No organomegaly or masses felt. Normal bowel sounds heard. Central nervous system: Alert and oriented. No focal neurological deficits. Extremities: Symmetric 5 x 5 power. Skin: No rashes, lesions or ulcers Psychiatry: Judgement and insight appear normal. Mood & affect appropriate.     Data Reviewed: I have personally reviewed following labs and imaging studies  CBC: No results for input(s): WBC, NEUTROABS, HGB, HCT, MCV, PLT in the last 168 hours. Basic Metabolic Panel: No results for input(s): NA, K, CL, CO2, GLUCOSE, BUN, CREATININE, CALCIUM, MG, PHOS in the last 168 hours. GFR: CrCl cannot be calculated (Patient's most recent lab result is older than the maximum 21 days  allowed.). Liver Function Tests: No results for input(s): AST, ALT, ALKPHOS, BILITOT, PROT, ALBUMIN in the last 168 hours. No results for input(s): LIPASE, AMYLASE in the last 168 hours. No results for input(s): AMMONIA in the last 168 hours. Coagulation Profile: No results for input(s): INR, PROTIME in the last 168 hours. Cardiac Enzymes: No results for input(s): CKTOTAL, CKMB, CKMBINDEX, TROPONINI in the last 168 hours. BNP (last 3 results) No results for input(s): PROBNP in the last 8760 hours. HbA1C: No results for input(s): HGBA1C in the last 72 hours. CBG: No results for input(s): GLUCAP in the last 168 hours. Lipid Profile: No results for input(s): CHOL, HDL, LDLCALC, TRIG, CHOLHDL, LDLDIRECT in the last 72 hours. Thyroid Function Tests: No results for input(s): TSH, T4TOTAL, FREET4, T3FREE, THYROIDAB in the last 72 hours. Anemia Panel: No results for input(s): VITAMINB12, FOLATE, FERRITIN, TIBC, IRON, RETICCTPCT in  the last 72 hours. Sepsis Labs: No results for input(s): PROCALCITON, LATICACIDVEN in the last 168 hours.  No results found for this or any previous visit (from the past 240 hour(s)).       Radiology Studies: No results found.      Scheduled Meds: . nicotine  21 mg Transdermal Daily   Continuous Infusions: . sodium chloride 125 mL/hr at 09/16/18 0730  . chlorproMAZINE (THORAZINE) IV       LOS: 0 days    Time spent: 35 minutes    Irine Seal, MD [facility] Pager [phone]  If 7PM-7AM, please contact night-coverage [email or web address] Password [facility] 09/16/2018, 11:28 AM

## 2018-09-16 NOTE — Clinical Social Work Note (Signed)
CSW received consult for hospice admission. CSW has sent the referral to [facility]. CSW will update if any information becomes available.  Santiago Bumpers, MSW, [place]

## 2018-09-17 MED ORDER — LIDOCAINE VISCOUS HCL 2 % MT SOLN: 5.0000 mL | Freq: Four times a day (QID) | OROMUCOSAL | Status: DC | PRN

## 2018-09-17 MED ORDER — HYDROMORPHONE HCL 1 MG/ML IJ SOLN
1.0000 mg | INTRAMUSCULAR | Status: DC | PRN
  Administered 2018-09-17 – 2018-09-19 (×7): 1 mg via INTRAVENOUS
  Filled 2018-09-17 (×8): qty 1

## 2018-09-17 MED ORDER — LUBIPROSTONE 24 MCG PO CAPS
24.0000 ug | ORAL_CAPSULE | Freq: Two times a day (BID) | ORAL | Status: DC
  Administered 2018-09-17 – 2018-09-18 (×2): 24 ug via ORAL
  Filled 2018-09-17 (×3): qty 1

## 2018-09-17 MED ORDER — BISACODYL 10 MG RE SUPP: 10.0000 mg | Freq: Every day | RECTAL | Status: DC | PRN

## 2018-09-17 MED ORDER — GUAIFENESIN 100 MG/5ML PO SOLN
5.0000 mL | Freq: Three times a day (TID) | ORAL | Status: DC
  Administered 2018-09-17 – 2018-09-19 (×6): 100 mg via ORAL
  Filled 2018-09-17 (×6): qty 5

## 2018-09-17 NOTE — Progress Notes (Signed)
PROGRESS NOTE    DEMARRIO Durham  FXT:[id number] DOB: 1946-04-20 DOA: 09/15/2018 PCP: [name], MD   Brief Narrative:  Per Dr Doyce Loose is a 72 y.o. male with medical history significant of squamous cell carcinoma of the tongue, stage I laryngeal cancer, chronic pain, hypertension presenting to the hospital for evaluation of failure to thrive.  Wife states that patient has metastatic tongue cancer.  He had previously declined trach opening and feeding tube in March.  Patient has significantly declined over the past 3 weeks.  He has been on home hospice for 3 weeks and is currently on a Dilaudid PCA pump.  Wife states he has not been eating anything for the past 3 days.  Not even able to swallow liquids.  He has been hallucinating, picking at things, and not sleeping.  He has had 2 falls at home over the past 1 week.  Patient is complaining of pain on the right side of his abdomen which started in the emergency room. I had a goals of care discussion with the patient and his wife.  They want comfort care measures only.  They do not want any labs or imaging done.  They are okay with IV pain medication and IV hydration.  Wife understands that patient is at high risk for choking but would like to continue his liquid diet to keep him comfortable in the setting of his terminal illness.  States she spoke to [place] who is a Retail banker and was informed there is no vacancy at inpatient hospice.  As such, they were advised to come into the hospital.  Wife is requesting for the patient to go to inpatient hospice.  Patient is in agreement with his wife and states he just wants to be comfortable and does not want any testing done.  He is requesting medication for his pain.  ED Course: No testing done and no medications administered.  Dilaudid    Assessment & Plan:   Principal Problem:   Failure to thrive in adult Active Problems:   Carcinoma of true vocal cord (HCC)   Tobacco abuse   Tongue cancer (HCC)   Failure to thrive (0-17)   Generalized abdominal pain  1 failure to thrive in the setting of terminal illness Patient presenting with extreme weakness and cachexia.  With history of squamous cell carcinoma of the tongue and laryngeal cancer.  Patient declined surgery and feeding tube placement in the past.  Patient had been at home with hospice over the past 3 weeks on a Dilaudid PCA pump at home however unable to tolerate any oral intake x3 days.  Patient with complaints of abdominal pain.  Dr. Marlowe Sax had a goals of care discussion with patient and wife and decision was made for comfort care measures only.  Family does not want any labs or imaging done.  Family is fine with IV pain medication IV hydration.  Patient to be maintained on a full liquid diet to keep him comfortable in the setting of terminal illness.  Continue full liquid diet for comfort.  Change Dulcolax suppositories to daily as needed.  Continue IV Thorazine as needed.  End-of-life order set has been placed.  Palliative care consulted for symptom management.  Social work consulted for residential hospice home placement.  2.  Abdominal pain Concern for constipation as patient on a Dilaudid PCA pump.  Some improvement after large bowel movement after Dulcolax suppository and soapsuds enema.  Change Dulcolax suppository to as needed. Follow.  3.  Squamous cell carcinoma of the tongue/laryngeal cancer Last seen by Dr. Delton Coombes in July 2019 of oncology.  Patient diagnosed per biopsy February 2019 seen by Dr. Elisabeth Cara of [facility].  In May 2019 Dr. Elisabeth Cara had recommended a left partial glossectomy, bilateral selective neck dissection and left radial forearm free flap reconstruction.  Feeding tube placement was also recommended at that time but patient decided not to undergo surgery.  Patient now on comfort measures.  Patient needing residential hospice home.  Social work consulted.  4.  Tobacco  use Patient with ongoing tobacco use 2 to 3 packs/day.  Continue nicotine patch.   DVT prophylaxis: Comfort care Code Status: DNR Family Communication: Updated patient.  No family at bedside. Disposition Plan: Needs residential hospice home.   Consultants:   None  Procedures:   None  Antimicrobials:   None   Subjective: Patient sitting up in bed.  Patient states some improvement with abdominal pain after bowel movement.  Patient asking for suppository not to be made daily.  No chest pain.  No shortness of breath.  Patient complaining of some congestion and asking for some Mucinex.    Objective: Vitals:   09/16/18 1350 09/16/18 2130 09/16/18 2142 09/17/18 0633  BP: (!) 140/91  126/84 112/80  Pulse: 87  (!) 102 (!) 103  Resp: 19  16 18   Temp: 98.4 F (36.9 C)  99.1 F (37.3 C) 98.2 F (36.8 C)  TempSrc: Oral  Axillary Oral  SpO2: 95% 97% 95% 95%  Weight:      Height:        Intake/Output Summary (Last 24 hours) at 09/17/2018 1224 Last data filed at 09/17/2018 0849 Gross per 24 hour  Intake 3782.84 ml  Output 2175 ml  Net 1607.84 ml   Filed Weights   09/15/18 1732 09/15/18 2212  Weight: 62.6 kg 54.5 kg    Examination:  General exam: Appears calm and comfortable.  Cachectic.  Chronically ill-appearing. Respiratory system: Lungs clear to auscultation bilaterally.  No wheezes, no crackles, no rhonchi.  Respiratory effort normal. Cardiovascular system: Regular rate rhythm no murmurs rubs or gallops.  No JVD.  No lower extremity edema.  Gastrointestinal system: Abdomen is soft, nontender, nondistended, positive bowel sounds.  No rebound.  No guarding.  Central nervous system: Alert and oriented. No focal neurological deficits. Extremities: Symmetric 5 x 5 power. Skin: No rashes, lesions or ulcers Psychiatry: Judgement and insight appear normal. Mood & affect appropriate.     Data Reviewed: I have personally reviewed following labs and imaging  studies  CBC: No results for input(s): WBC, NEUTROABS, HGB, HCT, MCV, PLT in the last 168 hours. Basic Metabolic Panel: No results for input(s): NA, K, CL, CO2, GLUCOSE, BUN, CREATININE, CALCIUM, MG, PHOS in the last 168 hours. GFR: CrCl cannot be calculated (Patient's most recent lab result is older than the maximum 21 days allowed.). Liver Function Tests: No results for input(s): AST, ALT, ALKPHOS, BILITOT, PROT, ALBUMIN in the last 168 hours. No results for input(s): LIPASE, AMYLASE in the last 168 hours. No results for input(s): AMMONIA in the last 168 hours. Coagulation Profile: No results for input(s): INR, PROTIME in the last 168 hours. Cardiac Enzymes: No results for input(s): CKTOTAL, CKMB, CKMBINDEX, TROPONINI in the last 168 hours. BNP (last 3 results) No results for input(s): PROBNP in the last 8760 hours. HbA1C: No results for input(s): HGBA1C in the last 72 hours. CBG: No results for input(s): GLUCAP in the last 168 hours.  Lipid Profile: No results for input(s): CHOL, HDL, LDLCALC, TRIG, CHOLHDL, LDLDIRECT in the last 72 hours. Thyroid Function Tests: No results for input(s): TSH, T4TOTAL, FREET4, T3FREE, THYROIDAB in the last 72 hours. Anemia Panel: No results for input(s): VITAMINB12, FOLATE, FERRITIN, TIBC, IRON, RETICCTPCT in the last 72 hours. Sepsis Labs: No results for input(s): PROCALCITON, LATICACIDVEN in the last 168 hours.  No results found for this or any previous visit (from the past 240 hour(s)).       Radiology Studies: No results found.      Scheduled Meds: . guaiFENesin  5 mL Oral TID  . lubiprostone  24 mcg Oral BID WC  . nicotine  21 mg Transdermal Daily   Continuous Infusions: . sodium chloride 75 mL/hr at 09/17/18 1005  . chlorproMAZINE (THORAZINE) IV       LOS: 1 day    Time spent: 35 minutes    Irine Seal, MD [facility] Pager [phone]  If 7PM-7AM, please contact  night-coverage [email or web address] Password [facility] 09/17/2018, 12:24 PM

## 2018-09-18 ENCOUNTER — Encounter (HOSPITAL_COMMUNITY): Payer: Self-pay | Admitting: Primary Care

## 2018-09-18 DIAGNOSIS — C320 Malignant neoplasm of glottis: Secondary | ICD-10-CM

## 2018-09-18 DIAGNOSIS — C069 Malignant neoplasm of mouth, unspecified: Secondary | ICD-10-CM

## 2018-09-18 DIAGNOSIS — R6251 Failure to thrive (child): Secondary | ICD-10-CM

## 2018-09-18 DIAGNOSIS — R1084 Generalized abdominal pain: Secondary | ICD-10-CM

## 2018-09-18 DIAGNOSIS — Z66 Do not resuscitate: Secondary | ICD-10-CM

## 2018-09-18 DIAGNOSIS — R627 Adult failure to thrive: Secondary | ICD-10-CM

## 2018-09-18 DIAGNOSIS — C029 Malignant neoplasm of tongue, unspecified: Principal | ICD-10-CM

## 2018-09-18 DIAGNOSIS — Z515 Encounter for palliative care: Secondary | ICD-10-CM

## 2018-09-18 DIAGNOSIS — Z720 Tobacco use: Secondary | ICD-10-CM

## 2018-09-18 MED ORDER — BISACODYL 10 MG RE SUPP
10.0000 mg | Freq: Two times a day (BID) | RECTAL | Status: DC | PRN
  Administered 2018-09-18: 10 mg via RECTAL
  Filled 2018-09-18: qty 1

## 2018-09-18 NOTE — Clinical Social Work Note (Signed)
Cassandra at [facility] indicates that patient is second on the wait list for a bed at the facility.    Valiant Dills, Clydene Pugh, LCSW

## 2018-09-18 NOTE — Consult Note (Signed)
Consultation Note Date: 09/18/2018   Patient Name: Kevin Durham  DOB: 1946/05/31  MRN: [id number]  Age / Sex: 72 y.o., male  PCP: [name], MD Referring Physician: Orson Eva, MD  Reason for Consultation: Establishing goals of care  HPI/Patient Profile: 72 y.o. male  with past medical history of squamous cell carcinoma of the tongue, stage I laryngeal cancer, tobacco abuse, failure to thrive, chronic pain, hypertension  admitted on 09/15/2018 with failure to thrive.   Clinical Assessment and Goals of Care: Kevin Durham is resting quietly in bed.  He makes and mostly keeps eye contact.  He is able to communicate his basic needs at this point.  There is no family at bedside at this time.  We talked about his comfort, we also talked about bowel regimen.  At this point he is active with hospice of [facility] and will be transitioned to residential hospice as a bed becomes available.  No further needs at this time.   HCPOA  NEXT OF KIN -  [name].  No HCPOA paperwork noted in Epic documents tab.    SUMMARY OF RECOMMENDATIONS   Family is requesting comfort and dignity at end of life, residential hospice with [facility].  Awaiting bed availability.   Code Status/Advance Care Planning:  DNR  Symptom Management:   Dilaudid pump at this time  Palliative Prophylaxis:   Delirium Protocol and Frequent Pain Assessment  Additional Recommendations (Limitations, Scope, Preferences):  Full Comfort Care  Psycho-social/Spiritual:   Desire for further Chaplaincy support:no  Additional Recommendations: Caregiving  Support/Resources and Education on Hospice  Prognosis:   < 2 weeks  Discharge Planning: requesting comfort and dignity at end of life, residential hospice with [facility].       Primary Diagnoses: Present on Admission: . Failure to thrive in adult . Tongue cancer  ([place]) . Carcinoma of true vocal cord ([place]) . Tobacco abuse . Failure to thrive (0-17)   I have reviewed the medical record, interviewed the patient and family, and examined the patient. The following aspects are pertinent.  Past Medical History:  Diagnosis Date  . Arthritis   . Chronic back pain   . Chronic neck pain   . GERD (gastroesophageal reflux disease)   . History of gout   . Hospice care patient   . Hypertension   . Tongue cancer [facility])    Social History   Socioeconomic History  . Marital status: Married    Spouse name: Not on file  . Number of children: Not on file  . Years of education: Not on file  . Highest education level: Not on file  Occupational History  . Not on file  Social Needs  . Financial resource strain: Not on file  . Food insecurity:    Worry: Not on file    Inability: Not on file  . Transportation needs:    Medical: Not on file    Non-medical: Not on file  Tobacco Use  . Smoking status: Current Every Day Smoker  Packs/day: 2.00    Years: 45.00    Pack years: 90.00    Types: Cigarettes  . Smokeless tobacco: Never Used  Substance and Sexual Activity  . Alcohol use: Yes    Comment:  Socially   . Drug use: No  . Sexual activity: Yes    Birth control/protection: None  Lifestyle  . Physical activity:    Days per week: Not on file    Minutes per session: Not on file  . Stress: Not on file  Relationships  . Social connections:    Talks on phone: Not on file    Gets together: Not on file    Attends religious service: Not on file    Active member of club or organization: Not on file    Attends meetings of clubs or organizations: Not on file    Relationship status: Not on file  Other Topics Concern  . Not on file  Social History Narrative  . Not on file   History reviewed. No pertinent family history. Scheduled Meds: . guaiFENesin  5 mL Oral TID  . lubiprostone  24 mcg Oral BID WC  . nicotine  21 mg Transdermal Daily    Continuous Infusions: . sodium chloride 75 mL/hr at 09/18/18 0846  . chlorproMAZINE (THORAZINE) IV     PRN Meds:.acetaminophen **OR** acetaminophen, albuterol, alum & mag hydroxide-simeth, antiseptic oral rinse, bisacodyl, chlorproMAZINE (THORAZINE) IV, diphenhydrAMINE **OR** diphenhydrAMINE, diphenhydrAMINE, glycopyrrolate **OR** glycopyrrolate **OR** glycopyrrolate, haloperidol **OR** haloperidol **OR** haloperidol lactate, HYDROmorphone (DILAUDID) injection, lidocaine, LORazepam **OR** LORazepam **OR** LORazepam, LORazepam **OR** LORazepam **OR** LORazepam, magic mouthwash, morphine injection, morphine CONCENTRATE **OR** morphine CONCENTRATE, nystatin, ondansetron (ZOFRAN) IV, ondansetron **OR** ondansetron (ZOFRAN) IV, polyvinyl alcohol, temazepam Medications Prior to Admission:  Prior to Admission medications   Medication Sig Start Date End Date Taking? Authorizing Provider  lidocaine (XYLOCAINE) 2 % solution Use as directed 5 mLs in the mouth or throat 4 (four) times daily as needed for mouth pain.   Yes [provider]  lubiprostone (AMITIZA) 24 MCG capsule Take 24 mcg by mouth 2 (two) times daily with a meal.   Yes [provider]  olmesartan (BENICAR) 20 MG tablet Take 20 mg by mouth daily.   Yes [provider]  oxyCODONE HCl 10 MG/0.5ML CONC Take 3 mLs by mouth every 2 (two) hours as needed.   Yes [provider]  torsemide (DEMADEX) 20 MG tablet Take 20 mg by mouth daily.   Yes [provider]   No Known Allergies Review of Systems  Unable to perform ROS: Acuity of condition    Physical Exam Vitals signs and nursing note reviewed.  Constitutional:      General: He is not in acute distress.    Appearance: He is ill-appearing.     Comments: Very weak and frail, able to make his needs known, makes and somewhat keeps eye contact  HENT:     Head:     Comments: Severe temporal wasting Cardiovascular:     Rate and Rhythm: Normal rate.   Pulmonary:     Effort: Pulmonary effort is normal. No respiratory distress.  Abdominal:     General: Abdomen is flat.  Skin:    General: Skin is warm and dry.  Neurological:     Mental Status: He is alert.     Comments: Alert, orientation questions not asked     Vital Signs: BP 124/88 (BP Location: Right Arm)   Pulse (!) 108   Temp 98.8 F (37.1 C)  Resp 17   Ht 6\' 4"  (1.93 m)   Wt 54.5 kg   SpO2 94%   BMI 14.63 kg/m  Pain Scale: 0-10   Pain Score: 7    SpO2: SpO2: 94 % O2 Device:SpO2: 94 % O2 Flow Rate: .   IO: Intake/output summary:   Intake/Output Summary (Last 24 hours) at 09/18/2018 1338 Last data filed at 09/18/2018 1158 Gross per 24 hour  Intake 2215.93 ml  Output 2001 ml  Net 214.93 ml    LBM: Last BM Date: 09/17/18 Baseline Weight: Weight: 62.6 kg Most recent weight: Weight: 54.5 kg     Palliative Assessment/Data:   Flowsheet Rows     Most Recent Value  Intake Tab  Referral Department  Hospitalist  Unit at Time of Referral  Med/Surg Unit  Palliative Care Primary Diagnosis  Cancer  Date Notified  09/16/18  Palliative Care Type  New Palliative care  Reason for referral  Counsel Regarding Hospice, Clarify Goals of Care  Date of Admission  09/16/18  Date first seen by Palliative Care  09/18/18  # of days Palliative referral response time  2 Day(s)  # of days IP prior to Palliative referral  0  Clinical Assessment  Palliative Performance Scale Score  20%  Pain Max last 24 hours  Not able to report  Pain Min Last 24 hours  Not able to report  Dyspnea Max Last 24 Hours  Not able to report  Dyspnea Min Last 24 hours  Not able to report  Psychosocial & Spiritual Assessment  Palliative Care Outcomes  Patient/Family meeting held?  Yes  Palliative Care Outcomes  Counseled regarding hospice, Clarified goals of care  Patient/Family wishes: Interventions discontinued/not started   Mechanical Ventilation      Time In: 1230 Time Out: 1305 Time  Total: 35 minutes  Greater than 50%  of this time was spent counseling and coordinating care related to the above assessment and plan.  Signed by: Drue Novel, NP   Please contact Palliative Medicine Team phone at [phone] for questions and concerns.  For individual provider: See Shea Evans

## 2018-09-18 NOTE — Progress Notes (Signed)
PROGRESS NOTE  Kevin Durham IRC:[id number] DOB: 03/19/1946 DOA: 09/15/2018 PCP: [name], MD  Brief History:  Per Dr Nicholes Rough Johnsonis a 72 y.o.malewith medical history significant ofsquamous cell carcinoma of the tongue, stage I laryngeal cancer, chronic pain, hypertension presenting to the hospital for evaluation of failure to thrive. Wife states that patient has metastatic tongue cancer. He had previously declined trach opening and feeding tube in March. Patient has significantly declined over the past 3 weeks. He has been on home hospice for 3 weeks and is currently on a Dilaudid PCA pump. Wife states he has not been eating anything for the past 3 days. Not even able to swallow liquids. He has been hallucinating, picking at things, and not sleeping. He has had 2 falls at home over the past 1 week. Patient is complaining of pain on the right side of his abdomen which started in the emergency room. I had a goals of care discussion with the patient and his wife. They want comfort care measures only. They do not want any labs or imaging done. They are okay with IV pain medication and IV hydration. Wife understands that patient is at high risk for choking but would like to continue his liquid diet to keep him comfortable in the setting of his terminal illness. States she spoke to [place] who is a Retail banker and was informed there is no vacancy at inpatient hospice. As such, they were advised to come into the hospital. Wife is requesting for the patient to go to inpatient hospice. Patient is in agreement with his wife and states he just wants to be comfortable and does not want any testing done. He is requesting medication for his pain.   Assessment/Plan: failure to thrive in the setting of terminal illness -Patient presenting with extreme weakness and cachexia.  With history of squamous cell carcinoma of the tongue and laryngeal cancer.   -Patient  declined surgery and feeding tube placement in the past.   -Patient had been at home with hospice over the past 3 weeks on a Dilaudid PCA pump at home however unable to tolerate any oral intake x3 days.   -Patient with complaints of abdominal pain.   -Dr. Marlowe Sax had a goals of care discussion with patient and wife and decision was made for comfort care measures only.  Family does not want any labs or imaging done. -Family is fine with IV pain medication IV hydration.   -Patient to be maintained on a full liquid diet to keep him comfortable in the setting of terminal illness.   -Continue full liquid diet for comfort.   -Change Dulcolax suppositories to daily as needed.  Continue IV Thorazine as needed.  End-of-life order set has been placed.   -Palliative care consulted  -Social work consulted for residential hospice home placement.  Abdominal pain -Concern for constipation as patient on a Dilaudid -Some improvement after large bowel movement after Dulcolax suppository and soapsuds enema.   -Change Dulcolax suppository to as needed. Follow.  Squamous cell carcinoma of the tongue/laryngeal cancer -Last seen by Dr. Delton Coombes in July 2019 of oncology.  Patient diagnosed per biopsy February 2019 seen by Dr. Elisabeth Cara of [facility].   -In May 2019 Dr. Elisabeth Cara had recommended a left partial glossectomy, bilateral selective neck dissection and left radial forearm free flap reconstruction.  Feeding tube placement was also recommended at that time but patient decided not to undergo surgery.   -  Patient now on comfort measures.  Patient needing residential hospice home.  Social work consulted.  Tobacco use Patient with ongoing tobacco use 2 to 3 packs/day.  Continue nicotine patch.       Disposition Plan:   Residential hospice when bed available Family Communication:   Family at bedside  Consultants: Palliative medicine  Code Status:  FULL comfort  DVT Prophylaxis: Full comfort  measures   Procedures: As Listed in Progress Note Above  Antibiotics: None  RN Pressure Injury Documentation:        Subjective: Patient denies fevers, chills, headache, chest pain, dyspnea, nausea, vomiting, diarrhea, abdominal pain, dysuria, hematuria, hematochezia, and melena.   Objective: Vitals:   09/17/18 1332 09/17/18 2215 09/18/18 0606 09/18/18 1317  BP: 123/86 (!) 129/94 (!) 134/94 124/88  Pulse: (!) 105 (!) 121 (!) 114 (!) 108  Resp: 18 (!) 24 16 17   Temp: 98.1 F (36.7 C) 98.9 F (37.2 C)  98.8 F (37.1 C)  TempSrc: Oral Axillary    SpO2: 94% 91% 95% 94%  Weight:      Height:        Intake/Output Summary (Last 24 hours) at 09/18/2018 1823 Last data filed at 09/18/2018 1514 Gross per 24 hour  Intake 2787.26 ml  Output 1150 ml  Net 1637.26 ml   Weight change:  Exam:   General:  Pt is alert, follows commands appropriately, not in acute distress  HEENT: No icterus, No thrush, No neck mass, [place]/AT  Cardiovascular: RRR, S1/S2, no rubs, no gallops  Respiratory: Diminished breath sounds at the bases.  Bibasilar rales.  Abdomen: Soft/+BS, mild diffuse tender, non distended, no guarding  Extremities: No edema, No lymphangitis, No petechiae, No rashes, no synovitis   Data Reviewed: I have personally reviewed following labs and imaging studies Basic Metabolic Panel: No results for input(s): NA, K, CL, CO2, GLUCOSE, BUN, CREATININE, CALCIUM, MG, PHOS in the last 168 hours. Liver Function Tests: No results for input(s): AST, ALT, ALKPHOS, BILITOT, PROT, ALBUMIN in the last 168 hours. No results for input(s): LIPASE, AMYLASE in the last 168 hours. No results for input(s): AMMONIA in the last 168 hours. Coagulation Profile: No results for input(s): INR, PROTIME in the last 168 hours. CBC: No results for input(s): WBC, NEUTROABS, HGB, HCT, MCV, PLT in the last 168 hours. Cardiac Enzymes: No results for input(s): CKTOTAL, CKMB, CKMBINDEX, TROPONINI in  the last 168 hours. BNP: Invalid input(s): POCBNP CBG: No results for input(s): GLUCAP in the last 168 hours. HbA1C: No results for input(s): HGBA1C in the last 72 hours. Urine analysis: No results found for: COLORURINE, APPEARANCEUR, LABSPEC, PHURINE, GLUCOSEU, HGBUR, BILIRUBINUR, KETONESUR, PROTEINUR, UROBILINOGEN, NITRITE, LEUKOCYTESUR Sepsis Labs: @LABRCNTIP (procalcitonin:4,lacticidven:4) )No results found for this or any previous visit (from the past 240 hour(s)).   Scheduled Meds: . guaiFENesin  5 mL Oral TID  . lubiprostone  24 mcg Oral BID WC  . nicotine  21 mg Transdermal Daily   Continuous Infusions: . sodium chloride 75 mL/hr at 09/18/18 0846  . chlorproMAZINE (THORAZINE) IV      Procedures/Studies: No results found.  Orson Eva, DO  [facility] Pager [phone]  If 7PM-7AM, please contact night-coverage [email or web address] Password [email or web address] 09/18/2018, 6:23 PM   LOS: 2 days

## 2018-09-19 ENCOUNTER — Inpatient Hospital Stay (HOSPITAL_COMMUNITY)
Admission: EM | Admit: 2018-09-19 | Discharge: 2018-09-22 | DRG: 147 | Disposition: A | Discharge status: Hospice | Attending: Internal Medicine | Admitting: Internal Medicine

## 2018-09-19 DIAGNOSIS — R64 Cachexia: Secondary | ICD-10-CM | POA: Diagnosis present

## 2018-09-19 DIAGNOSIS — K5903 Drug induced constipation: Secondary | ICD-10-CM | POA: Diagnosis present

## 2018-09-19 DIAGNOSIS — M1990 Unspecified osteoarthritis, unspecified site: Secondary | ICD-10-CM | POA: Diagnosis present

## 2018-09-19 DIAGNOSIS — Z9842 Cataract extraction status, left eye: Secondary | ICD-10-CM

## 2018-09-19 DIAGNOSIS — T402X5A Adverse effect of other opioids, initial encounter: Secondary | ICD-10-CM | POA: Diagnosis present

## 2018-09-19 DIAGNOSIS — R109 Unspecified abdominal pain: Secondary | ICD-10-CM | POA: Diagnosis present

## 2018-09-19 DIAGNOSIS — Z720 Tobacco use: Secondary | ICD-10-CM | POA: Diagnosis not present

## 2018-09-19 DIAGNOSIS — Z961 Presence of intraocular lens: Secondary | ICD-10-CM | POA: Diagnosis present

## 2018-09-19 DIAGNOSIS — Z6821 Body mass index (BMI) 21.0-21.9, adult: Secondary | ICD-10-CM

## 2018-09-19 DIAGNOSIS — Z743 Need for continuous supervision: Secondary | ICD-10-CM | POA: Diagnosis not present

## 2018-09-19 DIAGNOSIS — C029 Malignant neoplasm of tongue, unspecified: Principal | ICD-10-CM | POA: Diagnosis present

## 2018-09-19 DIAGNOSIS — F17210 Nicotine dependence, cigarettes, uncomplicated: Secondary | ICD-10-CM | POA: Diagnosis present

## 2018-09-19 DIAGNOSIS — C320 Malignant neoplasm of glottis: Secondary | ICD-10-CM | POA: Diagnosis not present

## 2018-09-19 DIAGNOSIS — G8929 Other chronic pain: Secondary | ICD-10-CM | POA: Diagnosis present

## 2018-09-19 DIAGNOSIS — I959 Hypotension, unspecified: Secondary | ICD-10-CM | POA: Diagnosis not present

## 2018-09-19 DIAGNOSIS — K921 Melena: Secondary | ICD-10-CM | POA: Diagnosis not present

## 2018-09-19 DIAGNOSIS — K219 Gastro-esophageal reflux disease without esophagitis: Secondary | ICD-10-CM | POA: Diagnosis present

## 2018-09-19 DIAGNOSIS — R296 Repeated falls: Secondary | ICD-10-CM | POA: Diagnosis present

## 2018-09-19 DIAGNOSIS — Y92009 Unspecified place in unspecified non-institutional (private) residence as the place of occurrence of the external cause: Secondary | ICD-10-CM | POA: Diagnosis not present

## 2018-09-19 DIAGNOSIS — R627 Adult failure to thrive: Secondary | ICD-10-CM | POA: Diagnosis present

## 2018-09-19 DIAGNOSIS — R443 Hallucinations, unspecified: Secondary | ICD-10-CM | POA: Diagnosis present

## 2018-09-19 DIAGNOSIS — C799 Secondary malignant neoplasm of unspecified site: Secondary | ICD-10-CM | POA: Diagnosis present

## 2018-09-19 DIAGNOSIS — C329 Malignant neoplasm of larynx, unspecified: Secondary | ICD-10-CM | POA: Diagnosis present

## 2018-09-19 DIAGNOSIS — I10 Essential (primary) hypertension: Secondary | ICD-10-CM | POA: Diagnosis present

## 2018-09-19 DIAGNOSIS — M109 Gout, unspecified: Secondary | ICD-10-CM | POA: Diagnosis present

## 2018-09-19 DIAGNOSIS — Z79899 Other long term (current) drug therapy: Secondary | ICD-10-CM | POA: Diagnosis not present

## 2018-09-19 DIAGNOSIS — M542 Cervicalgia: Secondary | ICD-10-CM | POA: Diagnosis present

## 2018-09-19 DIAGNOSIS — M549 Dorsalgia, unspecified: Secondary | ICD-10-CM | POA: Diagnosis present

## 2018-09-19 DIAGNOSIS — C069 Malignant neoplasm of mouth, unspecified: Secondary | ICD-10-CM

## 2018-09-19 DIAGNOSIS — Z9841 Cataract extraction status, right eye: Secondary | ICD-10-CM | POA: Diagnosis not present

## 2018-09-19 DIAGNOSIS — Z66 Do not resuscitate: Secondary | ICD-10-CM

## 2018-09-19 DIAGNOSIS — Z515 Encounter for palliative care: Secondary | ICD-10-CM | POA: Diagnosis not present

## 2018-09-19 MED ORDER — GLYCOPYRROLATE 1 MG PO TABS: 1.0000 mg | ORAL_TABLET | ORAL | Status: DC | PRN

## 2018-09-19 MED ORDER — LORAZEPAM 1 MG PO TABS: 1.0000 mg | ORAL_TABLET | ORAL | Status: DC | PRN

## 2018-09-19 MED ORDER — HALOPERIDOL LACTATE 5 MG/ML IJ SOLN: 0.5000 mg | INTRAMUSCULAR | Status: DC | PRN

## 2018-09-19 MED ORDER — LORAZEPAM 2 MG/ML PO CONC
1.0000 mg | ORAL | Status: DC | PRN
  Filled 2018-09-19: qty 0.5

## 2018-09-19 MED ORDER — GLYCOPYRROLATE 0.2 MG/ML IJ SOLN: 0.2000 mg | INTRAMUSCULAR | Status: DC | PRN

## 2018-09-19 MED ORDER — POLYVINYL ALCOHOL 1.4 % OP SOLN: 1.0000 [drp] | Freq: Four times a day (QID) | OPHTHALMIC | Status: DC | PRN

## 2018-09-19 MED ORDER — HYDROMORPHONE HCL 1 MG/ML IJ SOLN
1.0000 mg | INTRAMUSCULAR | Status: DC | PRN
  Administered 2018-09-19 – 2018-09-22 (×13): 1 mg via INTRAVENOUS
  Filled 2018-09-19 (×13): qty 1

## 2018-09-19 MED ORDER — HALOPERIDOL LACTATE 2 MG/ML PO CONC: 0.5000 mg | ORAL | Status: DC | PRN

## 2018-09-19 MED ORDER — ONDANSETRON 4 MG PO TBDP: 4.0000 mg | ORAL_TABLET | Freq: Four times a day (QID) | ORAL | Status: DC | PRN

## 2018-09-19 MED ORDER — HALOPERIDOL 0.5 MG PO TABS: 0.5000 mg | ORAL_TABLET | ORAL | Status: DC | PRN

## 2018-09-19 MED ORDER — LORAZEPAM 2 MG/ML IJ SOLN: 1.0000 mg | INTRAMUSCULAR | Status: DC | PRN

## 2018-09-19 MED ORDER — HALOPERIDOL LACTATE 2 MG/ML PO CONC
0.5000 mg | ORAL | Status: DC | PRN
  Filled 2018-09-19: qty 0.3

## 2018-09-19 MED ORDER — ACETAMINOPHEN 325 MG PO TABS: 650.0000 mg | ORAL_TABLET | Freq: Four times a day (QID) | ORAL | Status: DC | PRN

## 2018-09-19 MED ORDER — ONDANSETRON HCL 4 MG/2ML IJ SOLN: 4.0000 mg | Freq: Four times a day (QID) | INTRAMUSCULAR | Status: DC | PRN

## 2018-09-19 MED ORDER — BIOTENE DRY MOUTH MT LIQD: 15.0000 mL | OROMUCOSAL | Status: DC | PRN

## 2018-09-19 MED ORDER — MORPHINE SULFATE (CONCENTRATE) 10 MG/0.5ML PO SOLN: 5.0000 mg | ORAL | Status: DC | PRN

## 2018-09-19 MED ORDER — ACETAMINOPHEN 650 MG RE SUPP: 650.0000 mg | Freq: Four times a day (QID) | RECTAL | Status: DC | PRN

## 2018-09-19 NOTE — H&P (Deleted)
  The note originally documented on this encounter has been moved the the encounter in which it belongs.  

## 2018-09-19 NOTE — Discharge Summary (Signed)
Physician Discharge Summary  Kevin Durham:[id number] DOB: 1946/01/06 DOA: 09/15/2018  PCP: [name], MD  Admit date: 09/15/2018 Discharge date: 09/19/2018  Admitted From: Home Disposition: Residential hospice     Discharge Condition: Stable CODE STATUS: Full comfort Diet recommendation: Full liquids for comfort   Brief/Interim Summary: Kevin Clugston Johnsonis a 72 y.o.malewith medical history significant ofsquamous cell carcinoma of the tongue, stage I laryngeal cancer, chronic pain, hypertension presenting to the hospital for evaluation of failure to thrive. Wife states that patient has metastatic tongue cancer. He had previously declined trach opening and feeding tube in March. Patient has significantly declined over the past 3 weeks. He has been on home hospice for 3 weeks and is currently on a Dilaudid PCA pump. Wife states he has not been eating anything for the past 3 days. Not even able to swallow liquids. He has been hallucinating, picking at things, and not sleeping. He has had 2 falls at home over the past 1 week. Patient is complaining of pain on the right side of his abdomen which started in the emergency room. I had a goals of care discussion with the patient and his wife. They want comfort care measures only. They do not want any labs or imaging done. They are okay with IV pain medication and IV hydration. Wife understands that patient is at high risk for choking but would like to continue his liquid diet to keep him comfortable in the setting of his terminal illness. States she spoke to [place] who is a Retail banker and was informed there is no vacancy at inpatient hospice. As such, they were advised to come into the hospital. Wife is requesting for the patient to go to inpatient hospice. Patient is in agreement with his wife and states he just wants to be comfortable and does not want any testing done. He is requesting medication for his  pain. Palliative medicine was consulted.  They assisted with symptom management.  The patient and wife were agreeable to transition to residential hospice.  Unfortunately a bed was not available.  As result, the patient was transitioned to general inpatient hospice status until a bed is available.  Discharge Diagnoses:  failure to thrive in the setting of terminal illness -Patient presenting with extreme weakness and cachexia. With history of squamous cell carcinoma of the tongue and laryngeal cancer.  -Patient declined surgery and feeding tube placement in the past.  -Patient had been at home with hospice over the past 3 weeks on a Dilaudid PCA pump at home however unable to tolerate any oral intake x3 days.  -Patient with complaints of abdominal pain.  -Dr. Marlowe Sax had a goals of care discussion with patient and wife and decision was made for comfort care measures only. Family does not want any labs or imaging done. -Family is fine with IV pain medication IV hydration.  -Patient to be maintained on a full liquid diet to keep him comfortable in the setting of terminal illness.  -Continue full liquid diet for comfort.  -Change Dulcolax suppositories to daily as needed. Continue IV Thorazine as needed. End-of-life order set has been placed.  -Palliative care consulted  -Social work consulted for residential hospice home placement.  Abdominal pain -Concern for constipation as patient on a Dilaudid -Some improvement after large bowel movement after Dulcolax suppository and soapsuds enema.  -Change Dulcolax suppository to as needed. Follow.  Squamous cell carcinoma of the tongue/laryngeal cancer -Last seen by Dr. Delton Coombes in July 2019 of oncology. Patient  diagnosed per biopsy February 2019 seen by Dr. Elisabeth Cara of [facility].  -In May 2019 Dr. Elisabeth Cara had recommended a left partial glossectomy, bilateral selective neck dissection and left radial forearm free flap  reconstruction. Feeding tube placement was also recommended at that time but patient decided not to undergo surgery.  -Patient now on comfort measures.Patient needing residential hospice home. Social work consulted.  Tobacco use Patient with ongoing tobacco use 2 to 3 packs/day. Continue nicotine patch.    Discharge Instructions   Allergies as of 09/19/2018   No Known Allergies     Medication List    STOP taking these medications   lubiprostone 24 MCG capsule Commonly known as:  AMITIZA   olmesartan 20 MG tablet Commonly known as:  BENICAR   oxyCODONE HCl 10 MG/0.5ML Conc   torsemide 20 MG tablet Commonly known as:  DEMADEX     TAKE these medications   lidocaine 2 % solution Commonly known as:  XYLOCAINE Use as directed 5 mLs in the mouth or throat 4 (four) times daily as needed for mouth pain.       No Known Allergies  Consultations:  Palliative medicine   Procedures/Studies:  No results found.      Discharge Exam: Vitals:   09/18/18 2150 09/19/18 1325  BP: 117/82 123/85  Pulse: (!) 112 (!) 105  Resp:  19  Temp: (!) 97.5 F (36.4 C) 98.1 F (36.7 C)  SpO2: 98% 91%   Vitals:   09/18/18 0606 09/18/18 1317 09/18/18 2150 09/19/18 1325  BP: (!) 134/94 124/88 117/82 123/85  Pulse: (!) 114 (!) 108 (!) 112 (!) 105  Resp: 16 17  19   Temp:  98.8 F (37.1 C) (!) 97.5 F (36.4 C) 98.1 F (36.7 C)  TempSrc:   Oral Oral  SpO2: 95% 94% 98% 91%  Weight:      Height:        General: Pt is alert, awake, not in acute distress Cardiovascular: RRR, S1/S2 +, no rubs, no gallops Respiratory: Bibasilar rales.  No wheezing.  Good air movement Abdominal: Soft, NT, ND, bowel sounds + Extremities: no edema, no cyanosis   The results of significant diagnostics from this hospitalization (including imaging, microbiology, ancillary and laboratory) are listed below for reference.    Significant Diagnostic Studies: No results  found.   Microbiology: No results found for this or any previous visit (from the past 240 hour(s)).   Labs: Basic Metabolic Panel: No results for input(s): NA, K, CL, CO2, GLUCOSE, BUN, CREATININE, CALCIUM, MG, PHOS in the last 168 hours. Liver Function Tests: No results for input(s): AST, ALT, ALKPHOS, BILITOT, PROT, ALBUMIN in the last 168 hours. No results for input(s): LIPASE, AMYLASE in the last 168 hours. No results for input(s): AMMONIA in the last 168 hours. CBC: No results for input(s): WBC, NEUTROABS, HGB, HCT, MCV, PLT in the last 168 hours. Cardiac Enzymes: No results for input(s): CKTOTAL, CKMB, CKMBINDEX, TROPONINI in the last 168 hours. BNP: Invalid input(s): POCBNP CBG: No results for input(s): GLUCAP in the last 168 hours.  Time coordinating discharge:  36 minutes  Signed:  Orson Eva, DO [facility] Pager: [phone] 09/19/2018, 5:44 PM

## 2018-09-19 NOTE — Progress Notes (Signed)
Patient expressed no immediate need at the time of visit, however, expressed thanks for the visit.

## 2018-09-19 NOTE — H&P (Signed)
History and Physical  [name] HQI:[id number] DOB: 1946/08/20 DOA: 09/15/2018   PCP: [name], MD   Patient coming from: Home  Chief Complaint: Failure to thrive  HPI:  Kevin Durham is a 72 y.o. male with medical history of squamous cell carcinoma of the tongue, stage I laryngeal cancer, chronic pain, hypertension presenting to the hospital for evaluation of failure to thrive. Wife states that patient has metastatic tongue cancer. He had previously declined trach opening and feeding tube in March. Patient has significantly declined over the past 3 weeks. He has been on home hospice for 3 weeks and is currently on a Dilaudid PCA pump. Wife states he has not been eating anything for the past 3 days. Not even able to swallow liquids. He has been hallucinating, picking at things, and not sleeping. He has had 2 falls at home over the past 1 week. Patient is complaining of pain on the right side of his abdomen which started in the emergency room. I had a goals of care discussion with the patient and his wife. They want comfort care measures only. They do not want any labs or imaging done. They are okay with IV pain medication and IV hydration. Wife understands that patient is at high risk for choking but would like to continue his liquid diet to keep him comfortable in the setting of his terminal illness. States she spoke to [place] who is a Retail banker and was informed there is no vacancy at inpatient hospice. As such, they were advised to come into the hospital. Wife is requesting for the patient to go to inpatient hospice. Patient is in agreement with his wife and states he just wants to be comfortable and does not want any testing done. He is requesting medication for his pain. Palliative medicine was consulted.  They assisted with symptom management.  The patient and wife were agreeable to transition to residential hospice.  Unfortunately a bed was not available.   As result, the patient was transitioned to general inpatient hospice status until a bed is available.  Assessment/Plan: failure to thrive in the setting of terminal illness -Patient presenting with extreme weakness and cachexia. With history of squamous cell carcinoma of the tongue and laryngeal cancer. -Patient declined surgery and feeding tube placement in the past. -Patient had been at home with hospice over the past 3 weeks on a Dilaudid PCA pump at home however unable to tolerate any oral intake x3 days. -Patient with complaints of abdominal pain. -Dr. Marlowe Sax had a goals of care discussion with patient and wife and decision was made for comfort care measures only. Family does not want any labs or imaging done.-Family is fine with IV pain medication IV hydration. -Patient to be maintained on a full liquid diet to keep him comfortable in the setting of terminal illness. -Continue full liquid diet for comfort. -Change Dulcolax suppositories to daily as needed. Continue IV Thorazine as needed. End-of-life order set has been placed. -Palliative care consulted -Social work consulted for residential hospice home placement.  Abdominal pain -Concern for constipation as patient on a Dilaudid -Some improvement after large bowel movement after Dulcolax suppository and soapsuds enema. -Change Dulcolax suppository to as needed. Follow.  Squamous cell carcinoma of the tongue/laryngeal cancer -Last seen by Dr. Delton Coombes in July 2019 of oncology. Patient diagnosed per biopsy February 2019 seen by Dr. Elisabeth Cara of [facility]. -In May 2019 Dr. Elisabeth Cara had recommended a left partial glossectomy, bilateral  selective neck dissection and left radial forearm free flap reconstruction. Feeding tube placement was also recommended at that time but patient decided not to undergo surgery. -Patient now on comfort measures.Patient needing residential hospice home. Social  work consulted.  Tobacco use Patient with ongoing tobacco use 2 to 3 packs/day. Continue nicotine patch.         Past Medical History:  Diagnosis Date  . Arthritis   . Chronic back pain   . Chronic neck pain   . GERD (gastroesophageal reflux disease)   . History of gout   . Hospice care patient   . Hypertension   . Tongue cancer [facility])    Past Surgical History:  Procedure Laterality Date  . ANTERIOR CRUCIATE LIGAMENT REPAIR Left 1967  . CATARACT EXTRACTION W/PHACO Left 10/02/2013   Procedure: CATARACT EXTRACTION PHACO AND INTRAOCULAR LENS PLACEMENT (IOC);  Surgeon: Elta Guadeloupe T. Gershon Crane, MD;  Location: [facility];  Service: Ophthalmology;  Laterality: Left;  CDE:18.90  . CATARACT EXTRACTION W/PHACO Right 10/16/2013   Procedure: CATARACT EXTRACTION PHACO AND INTRAOCULAR LENS PLACEMENT (IOC);  Surgeon: Elta Guadeloupe T. Gershon Crane, MD;  Location: [facility];  Service: Ophthalmology;  Laterality: Right;  CDE:13.69  . CERVICAL DISC SURGERY    . EYE SURGERY    . LUMBAR DISC SURGERY    . MICROLARYNGOSCOPY Left 06/10/2015   Procedure: MICRODIRECT LARYNGOSCOPY WITH BIOPSY OF THE LEFT VOCAL CORD;  Surgeon: Leta Baptist, MD;  Location: [place];  Service: ENT;  Laterality: Left;   Social History:  reports that he has been smoking cigarettes. He has a 90.00 pack-year smoking history. He has never used smokeless tobacco. He reports current alcohol use. He reports that he does not use drugs.   History reviewed. No pertinent family history.   No Known Allergies   Prior to Admission medications   Medication Sig Start Date End Date Taking? Authorizing Provider  lidocaine (XYLOCAINE) 2 % solution Use as directed 5 mLs in the mouth or throat 4 (four) times daily as needed for mouth pain.   Yes [provider]  lubiprostone (AMITIZA) 24 MCG capsule Take 24 mcg by mouth 2 (two) times daily with a meal.   Yes [provider]  olmesartan (BENICAR) 20 MG tablet Take 20 mg by mouth daily.    Yes [provider]  oxyCODONE HCl 10 MG/0.5ML CONC Take 3 mLs by mouth every 2 (two) hours as needed.   Yes [provider]  torsemide (DEMADEX) 20 MG tablet Take 20 mg by mouth daily.   Yes [provider]    Review of Systems:  Constitutional:  No weight loss, night sweats, Head&Eyes: No headache.  No vision loss.  No eye pain or scotoma ENT:  No Difficulty swallowing,Tooth/dental problems,Sore throat,  No ear ache, post nasal drip,  Cardio-vascular:  No chest pain, Orthopnea, PND, swelling in lower extremities,  dizziness, palpitations  GI:  No   vomiting, diarrhea, loss of appetite, hematochezia, melena, heartburn, indigestion, Resp:  No shortness of breath with exertion or at rest. No cough. No coughing up of blood .No wheezing.No chest wall deformity  Skin:  no rash or lesions.  GU:  no dysuria, change in color of urine, no urgency or frequency. No flank pain.  Musculoskeletal:  No joint pain or swelling. No decreased range of motion. No back pain.  Psych:   Neurologic: No headache, no dysesthesia, no focal weakness, no vision loss. No syncope  Physical Exam: Vitals:   09/18/18 0606 09/18/18 1317  09/18/18 2150 09/19/18 1325  BP: (!) 134/94 124/88 117/82 123/85  Pulse: (!) 114 (!) 108 (!) 112 (!) 105  Resp: 16 17  19   Temp:  98.8 F (37.1 C) (!) 97.5 F (36.4 C) 98.1 F (36.7 C)  TempSrc:   Oral Oral  SpO2: 95% 94% 98% 91%  Weight:      Height:       General:  A&O x 3, NAD, nontoxic, pleasant/cooperative Head/Eye: No conjunctival hemorrhage, no icterus, [place]/AT, No nystagmus ENT:  No icterus,  No thrush, good dentition, no pharyngeal exudate Neck:  No masses, no lymphadenpathy, no bruits CV:  RRR, no rub, no gallop, no S3 Lung:  bibasilar rales.  No wheezing.  Good air movement Abdomen: soft/NT, +BS, nondistended, no peritoneal signs Ext: No cyanosis, No rashes, No petechiae, No lymphangitis, No edema Neuro: CNII-XII intact,  strength 4/5 in bilateral upper and lower extremities, no dysmetria  Labs on Admission:  Basic Metabolic Panel: No results for input(s): NA, K, CL, CO2, GLUCOSE, BUN, CREATININE, CALCIUM, MG, PHOS in the last 168 hours. Liver Function Tests: No results for input(s): AST, ALT, ALKPHOS, BILITOT, PROT, ALBUMIN in the last 168 hours. No results for input(s): LIPASE, AMYLASE in the last 168 hours. No results for input(s): AMMONIA in the last 168 hours. CBC: No results for input(s): WBC, NEUTROABS, HGB, HCT, MCV, PLT in the last 168 hours. Coagulation Profile: No results for input(s): INR, PROTIME in the last 168 hours. Cardiac Enzymes: No results for input(s): CKTOTAL, CKMB, CKMBINDEX, TROPONINI in the last 168 hours. BNP: Invalid input(s): POCBNP CBG: No results for input(s): GLUCAP in the last 168 hours. Urine analysis: No results found for: COLORURINE, APPEARANCEUR, LABSPEC, PHURINE, GLUCOSEU, HGBUR, BILIRUBINUR, KETONESUR, PROTEINUR, UROBILINOGEN, NITRITE, LEUKOCYTESUR Sepsis Labs: @LABRCNTIP (procalcitonin:4,lacticidven:4) )No results found for this or any previous visit (from the past 240 hour(s)).   Radiological Exams on Admission: No results found.      Time spent:60 minutes Code Status:   FULL COMFORT Family Communication:  No Family at bedside Disposition Plan: Transfer to residential hospice Consults called: None DVT Prophylaxis: Full comfort care  Nester Bachus, DO  [facility] Pager [phone]  If 7PM-7AM, please contact night-coverage [email or web address] Password [email or web address] 09/19/2018, 6:09 PM

## 2018-09-20 ENCOUNTER — Encounter (HOSPITAL_COMMUNITY): Payer: Self-pay | Admitting: *Deleted

## 2018-09-20 ENCOUNTER — Other Ambulatory Visit: Payer: Self-pay

## 2018-09-20 DIAGNOSIS — R627 Adult failure to thrive: Secondary | ICD-10-CM

## 2018-09-20 DIAGNOSIS — Z720 Tobacco use: Secondary | ICD-10-CM

## 2018-09-20 DIAGNOSIS — C029 Malignant neoplasm of tongue, unspecified: Principal | ICD-10-CM

## 2018-09-20 DIAGNOSIS — C320 Malignant neoplasm of glottis: Secondary | ICD-10-CM

## 2018-09-20 NOTE — Progress Notes (Signed)
PROGRESS NOTE  Kevin Durham YTK:[id number] DOB: 1946-02-19 DOA: 09/19/2018 PCP: [name], MD  Brief History:  73 y.o. male with medical history of squamous cell carcinoma of the tongue, stage I laryngeal cancer, chronic pain, hypertension presenting to the hospital for evaluation of failure to thrive. Wife states that patient has metastatic tongue cancer. He had previously declined trach opening and feeding tube in March. Patient has significantly declined over the past 3 weeks. He has been on home hospice for 3 weeks and is currently on a Dilaudid PCA pump. Wife states he has not been eating anything for the past 3 days. Not even able to swallow liquids. He has been hallucinating, picking at things, and not sleeping. He has had 2 falls at home over the past 1 week. Patient is complaining of pain on the right side of his abdomen which started in the emergency room. I had a goals of care discussion with the patient and his wife. They want comfort care measures only. They do not want any labs or imaging done. They are okay with IV pain medication and IV hydration. Wife understands that patient is at high risk for choking but would like to continue his liquid diet to keep him comfortable in the setting of his terminal illness. States she spoke to [place] who is a Retail banker and was informed there is no vacancy at inpatient hospice. As such, they were advised to come into the hospital. Wife is requesting for the patient to go to inpatient hospice. Patient is in agreement with his wife and states he just wants to be comfortable and does not want any testing done. He is requesting medication for his pain. Palliative medicine was consulted. They assisted with symptom management. The patient and wife were agreeable to transition to residential hospice. Unfortunately a bed was not available. As result, the patient was transitioned to general inpatient hospice status until  a bed is available.  Assessment/Plan: failure to thrive in the setting of terminal illness -Patient presenting with extreme weakness and cachexia. With history of squamous cell carcinoma of the tongue and laryngeal cancer. -Patient declined surgery and feeding tube placement in the past. -Patient had been at home with hospice over the past 3 weeks on a Dilaudid PCA pump at home however unable to tolerate any oral intake x3 days. -Patient with complaints of abdominal pain. -Dr. Marlowe Sax had a goals of care discussion with patient and wife and decision was made for comfort care measures only. Family does not want any labs or imaging done.-Family is fine with IV pain medication IV hydration. -Patient to be maintained on a full liquid diet to keep him comfortable in the setting of terminal illness. -Continue full liquid diet for comfort. -Change Dulcolax suppositories to daily as needed. Continue IV Thorazine as needed. End-of-life order set has been placed. -Palliative care consulted -Social work consulted for residential hospice home placement.  Abdominal pain -partly due to constipation as patient on a Dilaudid -Some improvement after large bowel movement after Dulcolax suppository and soapsuds enema. -Change Dulcolax suppository to as needed. Follow.  Squamous cell carcinoma of the tongue/laryngeal cancer -Last seen by Dr. Delton Coombes in July 2019 of oncology. Patient diagnosed per biopsy February 2019 seen by Dr. Elisabeth Cara of [facility]. -In May 2019 Dr. Elisabeth Cara had recommended a left partial glossectomy, bilateral selective neck dissection and left radial forearm free flap reconstruction. Feeding tube placement was also recommended at  that time but patient decided not to undergo surgery. -Patient now on comfort measures.Patient needing residential hospice home. Social work consulted.  Tobacco use Patient with ongoing tobacco use 2 to 3  packs/day. Continue nicotine patch.     Disposition Plan:   Residential hospice when bed is available Family Communication:   Family at bedside  Consultants: Palliative medicine  Code Status:  FULL COMFORT  DVT Prophylaxis: Full comfort   Procedures: As Listed in Progress Note Above  Antibiotics: None      Subjective: Patient denies fevers, chills, headache, chest pain, dyspnea, nausea, vomiting, diarrhea, abdominal pain, dysuria, hematuria, hematochezia, and melena.   Objective: Vitals:   09/19/18 2158 09/20/18 0622 09/20/18 0700 09/20/18 1546  BP: 105/76 113/80  101/71  Pulse: 98 (!) 101  (!) 101  Resp: 18 18  18   Temp: 98.2 F (36.8 C) 98.2 F (36.8 C)  97.6 F (36.4 C)  TempSrc: Axillary Oral  Oral  SpO2: 97% 100%  100%  Weight:   55 kg   Height:   6\' 4"  (1.93 m)     Intake/Output Summary (Last 24 hours) at 09/20/2018 1731 Last data filed at 09/20/2018 1700 Gross per 24 hour  Intake 720 ml  Output 1500 ml  Net -780 ml   Weight change:  Exam:   General:  Pt is alert, follows commands appropriately, not in acute distress  HEENT: No icterus, No thrush, No neck mass, [place]/AT  Cardiovascular: RRR, S1/S2, no rubs, no gallops  Respiratory: Fine bibasilar crackles.  No wheezing.  Good air movement.  Abdomen: Soft/+BS, non tender, non distended, no guarding  Extremities: No edema, No lymphangitis, No petechiae, No rashes, no synovitis   Data Reviewed: I have personally reviewed following labs and imaging studies Basic Metabolic Panel: No results for input(s): NA, K, CL, CO2, GLUCOSE, BUN, CREATININE, CALCIUM, MG, PHOS in the last 168 hours. Liver Function Tests: No results for input(s): AST, ALT, ALKPHOS, BILITOT, PROT, ALBUMIN in the last 168 hours. No results for input(s): LIPASE, AMYLASE in the last 168 hours. No results for input(s): AMMONIA in the last 168 hours. Coagulation Profile: No results for input(s): INR, PROTIME in the last 168  hours. CBC: No results for input(s): WBC, NEUTROABS, HGB, HCT, MCV, PLT in the last 168 hours. Cardiac Enzymes: No results for input(s): CKTOTAL, CKMB, CKMBINDEX, TROPONINI in the last 168 hours. BNP: Invalid input(s): POCBNP CBG: No results for input(s): GLUCAP in the last 168 hours. HbA1C: No results for input(s): HGBA1C in the last 72 hours. Urine analysis: No results found for: COLORURINE, APPEARANCEUR, LABSPEC, PHURINE, GLUCOSEU, HGBUR, BILIRUBINUR, KETONESUR, PROTEINUR, UROBILINOGEN, NITRITE, LEUKOCYTESUR Sepsis Labs: @LABRCNTIP (procalcitonin:4,lacticidven:4) )No results found for this or any previous visit (from the past 240 hour(s)).   Scheduled Meds: Continuous Infusions:  Procedures/Studies: No results found.  Orson Eva, DO  [facility] Pager [phone]  If 7PM-7AM, please contact night-coverage [email or web address] Password [email or web address] 09/20/2018, 5:31 PM   LOS: 1 day

## 2018-09-21 DIAGNOSIS — K921 Melena: Secondary | ICD-10-CM

## 2018-09-21 MED ORDER — HYDROCORTISONE ACETATE 25 MG RE SUPP: 25.0000 mg | Freq: Two times a day (BID) | RECTAL | Status: DC

## 2018-09-21 NOTE — Progress Notes (Signed)
PROGRESS NOTE  Kevin Durham NO ENI:[id number] DOB: [birth year] DOA: 09/19/2018 PCP: [name], MD  Brief History:  73 y.o.malewith medical history ofsquamous cell carcinoma of the tongue, stage I laryngeal cancer, chronic pain, hypertension presenting to the hospital for evaluation of failure to thrive. Wife states that patient has metastatic tongue cancer. He had previously declined trach opening and feeding tube in March. Patient has significantly declined over the past 3 weeks. He has been on home hospice for 3 weeks and is currently on a Dilaudid PCA pump. Wife states he has not been eating anything for the past 3 days. Not even able to swallow liquids. He has been hallucinating, picking at things, and not sleeping. He has had 2 falls at home over the past 1 week. Patient is complaining of pain on the right side of his abdomen which started in the emergency room. I had a goals of care discussion with the patient and his wife. They want comfort care measures only. They do not want any labs or imaging done. They are okay with IV pain medication and IV hydration. Wife understands that patient is at high risk for choking but would like to continue his liquid diet to keep him comfortable in the setting of his terminal illness. States she spoke to [place] who is a Retail banker and was informed there is no vacancy at inpatient hospice. As such, they were advised to come into the hospital. Wife is requesting for the patient to go to inpatient hospice. Patient is in agreement with his wife and states he just wants to be comfortable and does not want any testing done. He is requesting medication for his pain. Palliative medicine was consulted. They assisted with symptom management. The patient and wife were agreeable to transition to residential hospice. Unfortunately a bed was not available. As result, the patient was transitioned to general inpatient hospice status  until a bed is available.  Assessment/Plan: failure to thrive in the setting of terminal illness -Patient presenting with extreme weakness and cachexia. With history of squamous cell carcinoma of the tongue and laryngeal cancer. -Patient declined surgery and feeding tube placement in the past. -Patient had been at home with hospice over the past 3 weeks on a Dilaudid PCA pump at home however unable to tolerate any oral intake x3 days. -Patient with complaints of abdominal pain. -Dr. Marlowe Sax had a goals of care discussion with patient and wife and decision was made for comfort care measures only. Family does not want any labs or imaging done.-Family is fine with IV pain medication IV hydration. -Patient to be maintained on a full liquid diet to keep him comfortable in the setting of terminal illness. -Continue full liquid diet for comfort. -Change Dulcolax suppositories to daily as needed. Continue IV Thorazine as needed. End-of-life order set has been placed. -Palliative care consulted -Social work consulted for residential hospice home placement.  Melena/rectal burning -Discussed with the patient that the goal of his current care is for comfort without further diagnostic testing or blood draws with which he agreed -Try Anusol suppository -Continue opioids as needed pain  Abdominal pain -partly due to constipation as patient on a Dilaudid -Some improvement after large bowel movement after Dulcolax suppository and soapsuds enema. -Change Dulcolax suppository to as needed. Follow.  Squamous cell carcinoma of the tongue/laryngeal cancer -Last seen by Dr. Delton Coombes in July 2019 of oncology. Patient diagnosed per biopsy February 2019 seen by Dr. Elisabeth Cara  of [facility]. -In May 2019 Dr. Elisabeth Cara had recommended a left partial glossectomy, bilateral selective neck dissection and left radial forearm free flap reconstruction. Feeding tube placement was also  recommended at that time but patient decided not to undergo surgery. -Patient now on comfort measures.Patient needing residential hospice home. Social work consulted.  Tobacco use Patient with ongoing tobacco use 2 to 3 packs/day. Continue nicotine patch.     Disposition Plan:   Residential hospice when bed is available Family Communication:   No Family at bedside  Consultants: Palliative medicine  Code Status:  FULL COMFORT  DVT Prophylaxis: Full comfort   Procedures: As Listed in Progress Note Above  Antibiotics: None       Subjective: Patient complains of some melena, hematochezia and rectal burning with bowel movement.  He denies any fevers, chills, chest pain, shortness breath, nausea, vomiting, diarrhea, abdominal pain.  He has soft stools.  Objective: Vitals:   09/20/18 1546 09/20/18 2132 09/21/18 0605 09/21/18 1443  BP: 101/71 106/72 107/89 107/69  Pulse: (!) 101 96 93 88  Resp: 18 16 20 20   Temp: 97.6 F (36.4 C) 97.6 F (36.4 C) 99.4 F (37.4 C) 98.6 F (37 C)  TempSrc: Oral Oral Oral   SpO2: 100% 98% 100% 100%  Weight:      Height:        Intake/Output Summary (Last 24 hours) at 09/21/2018 1757 Last data filed at 09/21/2018 1300 Gross per 24 hour  Intake 360 ml  Output 750 ml  Net -390 ml   Weight change:  Exam:   General:  Pt is alert, follows commands appropriately, not in acute distress  HEENT: No icterus, No thrush, No neck mass, [place]/AT  Cardiovascular: RRR, S1/S2, no rubs, no gallops  Respiratory: Bibasilar rales.  No wheezing.  Good air movement.  Abdomen: Soft/+BS, non tender, non distended, no guarding  Extremities: No edema, No lymphangitis, No petechiae, No rashes, no synovitis  -Rectum--black soft stool noted.  No active bleeding.  No ulcerations noted.   Data Reviewed: I have personally reviewed following labs and imaging studies Basic Metabolic Panel: No results for input(s): NA, K, CL, CO2, GLUCOSE,  BUN, CREATININE, CALCIUM, MG, PHOS in the last 168 hours. Liver Function Tests: No results for input(s): AST, ALT, ALKPHOS, BILITOT, PROT, ALBUMIN in the last 168 hours. No results for input(s): LIPASE, AMYLASE in the last 168 hours. No results for input(s): AMMONIA in the last 168 hours. Coagulation Profile: No results for input(s): INR, PROTIME in the last 168 hours. CBC: No results for input(s): WBC, NEUTROABS, HGB, HCT, MCV, PLT in the last 168 hours. Cardiac Enzymes: No results for input(s): CKTOTAL, CKMB, CKMBINDEX, TROPONINI in the last 168 hours. BNP: Invalid input(s): POCBNP CBG: No results for input(s): GLUCAP in the last 168 hours. HbA1C: No results for input(s): HGBA1C in the last 72 hours. Urine analysis: No results found for: COLORURINE, APPEARANCEUR, LABSPEC, PHURINE, GLUCOSEU, HGBUR, BILIRUBINUR, KETONESUR, PROTEINUR, UROBILINOGEN, NITRITE, LEUKOCYTESUR Sepsis Labs: @LABRCNTIP (procalcitonin:4,lacticidven:4) )No results found for this or any previous visit (from the past 240 hour(s)).   Scheduled Meds: . hydrocortisone  25 mg Rectal BID   Continuous Infusions:  Procedures/Studies: No results found.  Orson Eva, DO  [facility] Pager [phone]  If 7PM-7AM, please contact night-coverage [email or web address] Password [email or web address] 09/21/2018, 5:57 PM   LOS: 2 days

## 2018-09-21 NOTE — Progress Notes (Signed)
Nutrition Brief Note  Chart reviewed. Pt now transitioning to comfort care.  No further nutrition interventions warranted at this time.  Please consult as needed.   Lajuan Lines, RD, LDN  After Hours/Weekend Pager: [phone]

## 2018-09-21 NOTE — Clinical Social Work Note (Signed)
Patient remains on residential hospice waiting list. Facility is currently full.    Cinque Begley, Clydene Pugh, LCSW

## 2018-09-21 NOTE — Progress Notes (Signed)
Patient is now having black/bloody stools. States he has had at least 4 BMs since early this morning. Complains of perineal area being raw feeling. Cream applied - will continue to monitor.

## 2018-09-22 MED ORDER — MORPHINE SULFATE (CONCENTRATE) 10 MG /0.5 ML PO SOLN: 5.0000 mg | ORAL | 0 refills | Status: AC | PRN

## 2018-09-22 MED ORDER — HYDROCORTISONE ACETATE 25 MG RE SUPP: 25.0000 mg | Freq: Two times a day (BID) | RECTAL | 0 refills | Status: AC

## 2018-09-22 MED ORDER — LORAZEPAM 2 MG/ML PO CONC: 1.0000 mg | ORAL | 0 refills | Status: AC | PRN

## 2018-09-22 NOTE — Clinical Social Work Note (Signed)
LCSW notified Mrs. Matura of patient's discharge to [facility]. Mrs. Haig indicated that she was aware of the discharge and that she was currently at the hospice facility. She stated that she would come back to the hospital to gather his belongings.   LCSW arranged EMS tranpsport.   Discharge clinicals sent to [facility].   LCSW signing off.    Etha Stambaugh, Clydene Pugh, LCSW

## 2018-09-22 NOTE — Discharge Summary (Signed)
Physician Discharge Summary  Kevin Durham ZOX:[id number] DOB: 02/01/1946 DOA: 09/19/2018  PCP: [name], MD  Admit date: 09/19/2018 Discharge date: 09/22/2018  Admitted From: Hospital Disposition:  Residential Hospice   Discharge Condition: Stable CODE STATUS: FULL COMFORT Diet recommendation:full liquids   Brief/Interim Summary: 73 y.o.malewith medical history ofsquamous cell carcinoma of the tongue, stage I laryngeal cancer, chronic pain, hypertension presenting to the hospital for evaluation of failure to thrive. Wife states that patient has metastatic tongue cancer. He had previously declined trach opening and feeding tube in March. Patient has significantly declined over the past 3 weeks. He has been on home hospice for 3 weeks and is currently on a Dilaudid PCA pump. Wife states he has not been eating anything for the past 3 days. Not even able to swallow liquids. He has been hallucinating, picking at things, and not sleeping. He has had 2 falls at home over the past 1 week. Patient is complaining of pain on the right side of his abdomen which started in the emergency room. I had a goals of care discussion with the patient and his wife. They want comfort care measures only. They do not want any labs or imaging done. They are okay with IV pain medication and IV hydration. Wife understands that patient is at high risk for choking but would like to continue his liquid diet to keep him comfortable in the setting of his terminal illness. States she spoke to [place] who is a Retail banker and was informed there is no vacancy at inpatient hospice. As such, they were advised to come into the hospital. Wife is requesting for the patient to go to inpatient hospice. Patient is in agreement with his wife and states he just wants to be comfortable and does not want any testing done. He is requesting medication for his pain. Palliative medicine was consulted. They assisted  with symptom management. The patient and wife were agreeable to transition to residential hospice. Unfortunately a bed was not available. As result, the patient was transitioned to general inpatient hospice status until a bed is available.  Discharge Diagnoses:  failure to thrive in the setting of terminal illness -Patient presenting with extreme weakness and cachexia. With history of squamous cell carcinoma of the tongue and laryngeal cancer. -Patient declined surgery and feeding tube placement in the past. -Patient had been at home with hospice over the past 3 weeks on a Dilaudid PCA pump at home however unable to tolerate any oral intake x3 days. -Patient with complaints of abdominal pain. -Dr. Marlowe Sax had a goals of care discussion with patient and wife and decision was made for comfort care measures only. Family does not want any labs or imaging done.-Family is fine with IV pain medication IV hydration. -Patient to be maintained on a full liquid diet to keep him comfortable in the setting of terminal illness. -Continue full liquid diet for comfort. -Change Dulcolax suppositories to daily as needed. Continue IV Thorazine as needed. End-of-life order set has been placed. -Social work consulted for residential hospice home placement. -pain controlled with IV dilaudid  Melena/rectal burning -Discussed with the patient that the goal of his current care is for comfort without further diagnostic testing or blood draws with which he agreed -Try Anusol suppository--helped burning -Continue opioids as needed pain  Abdominal pain -partly due toconstipation as patient on a Dilaudid -Some improvement after large bowel movement after Dulcolax suppository and soapsuds enema. -Change Dulcolax suppository to as needed.  -had soft BMs with  above  Squamous cell carcinoma of the tongue/laryngeal cancer -Last seen by Dr. Delton Coombes in July 2019 of oncology. Patient diagnosed  per biopsy February 2019 seen by Dr. Elisabeth Cara of [facility]. -In May 2019 Dr. Elisabeth Cara had recommended a left partial glossectomy, bilateral selective neck dissection and left radial forearm free flap reconstruction. Feeding tube placement was also recommended at that time but patient decided not to undergo surgery. -Patient now on comfort measures.Patient needing residential hospice home. Social work consulted.  Tobacco use Patient with ongoing tobacco use 2 to 3 packs/day. Continue nicotine patch.    Discharge Instructions   Allergies as of 09/22/2018   No Known Allergies     Medication List    TAKE these medications   hydrocortisone 25 MG suppository Commonly known as:  ANUSOL-HC Place 1 suppository (25 mg total) rectally 2 (two) times daily.   lidocaine 2 % solution Commonly known as:  XYLOCAINE Use as directed 5 mLs in the mouth or throat 4 (four) times daily as needed for mouth pain.   LORazepam 2 MG/ML concentrated solution Commonly known as:  ATIVAN Place 0.5 mLs (1 mg total) under the tongue every 4 (four) hours as needed for anxiety.   morphine CONCENTRATE 10 mg / 0.5 ml concentrated solution Take 0.25 mLs (5 mg total) by mouth every 2 (two) hours as needed for severe pain.       No Known Allergies  Consultations:  none   Procedures/Studies: No results found.      Discharge Exam: Vitals:   09/21/18 2151 09/22/18 0500  BP: (!) 71/47 90/64  Pulse: 98 (!) 102  Resp: 20 20  Temp: 98.4 F (36.9 C) (!) 97.3 F (36.3 C)  SpO2: (!) 86% (!) 78%   Vitals:   09/21/18 1443 09/21/18 2059 09/21/18 2151 09/22/18 0500  BP: 107/69  (!) 71/47 90/64  Pulse: 88  98 (!) 102  Resp: 20  20 20   Temp: 98.6 F (37 C)  98.4 F (36.9 C) (!) 97.3 F (36.3 C)  TempSrc:   Axillary Oral  SpO2: 100% 96% (!) 86% (!) 78%  Weight:      Height:        General: Pt is alert, awake, not in acute distress Cardiovascular: RRR, S1/S2 +, no rubs, no  gallops Respiratory: bibasilar rales, no wheeze Abdominal: Soft, NT, ND, bowel sounds + Extremities: no edema, no cyanosis   The results of significant diagnostics from this hospitalization (including imaging, microbiology, ancillary and laboratory) are listed below for reference.    Significant Diagnostic Studies: No results found.   Microbiology: No results found for this or any previous visit (from the past 240 hour(s)).   Labs: Basic Metabolic Panel: No results for input(s): NA, K, CL, CO2, GLUCOSE, BUN, CREATININE, CALCIUM, MG, PHOS in the last 168 hours. Liver Function Tests: No results for input(s): AST, ALT, ALKPHOS, BILITOT, PROT, ALBUMIN in the last 168 hours. No results for input(s): LIPASE, AMYLASE in the last 168 hours. No results for input(s): AMMONIA in the last 168 hours. CBC: No results for input(s): WBC, NEUTROABS, HGB, HCT, MCV, PLT in the last 168 hours. Cardiac Enzymes: No results for input(s): CKTOTAL, CKMB, CKMBINDEX, TROPONINI in the last 168 hours. BNP: Invalid input(s): POCBNP CBG: No results for input(s): GLUCAP in the last 168 hours.  Time coordinating discharge:  36 minutes  Signed:  Orson Eva, DO [facility] Pager: [phone] 09/22/2018, 12:49 PM

## 2018-09-22 NOTE — Progress Notes (Signed)
RN spoke with Dr. Olevia Bowens and informed him of patient's low BP of 71/47 and that patient is asymptomatic.  MD states to continue to monitor patient and make MD aware of any changes.  RN will continue to monitor.  P.J. Jenan Ellegood,RN

## 2018-10-21 DEATH — deceased

## 2019-07-29 IMAGING — CT CT CHEST W/ CM
3 of 7 series · 14 of 36 positions shown, 17 images · IV contrast (iopamidol)
Comparison: 07/07/2016

CLINICAL DATA: Tongue cancer.

EXAM:
CT CHEST WITH CONTRAST
TECHNIQUE: Multidetector CT imaging of the chest was performed during
intravenous contrast administration.
CONTRAST:  100mL [id number] IOPAMIDOL ([id number]) INJECTION 61%

[Series 2: axial st · axial · 0.69mm/px · z∈[-335,-55]mm · 9 of 176 slices shown, 12 images]
[im 18/176  mediastinal]
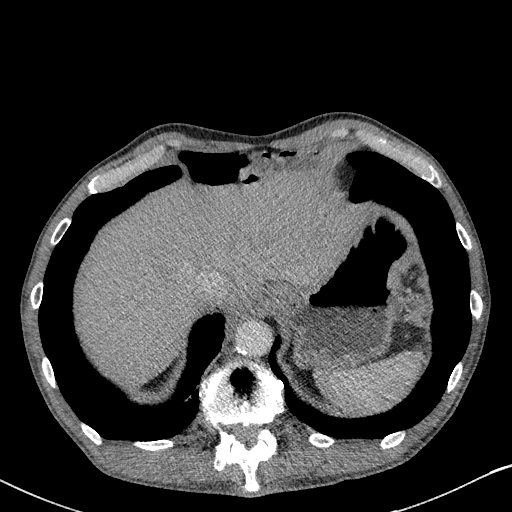
[im 18/176  lung]
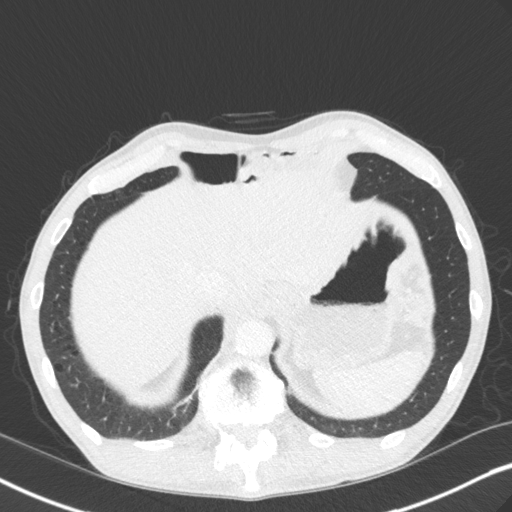
[im 36/176  lung]
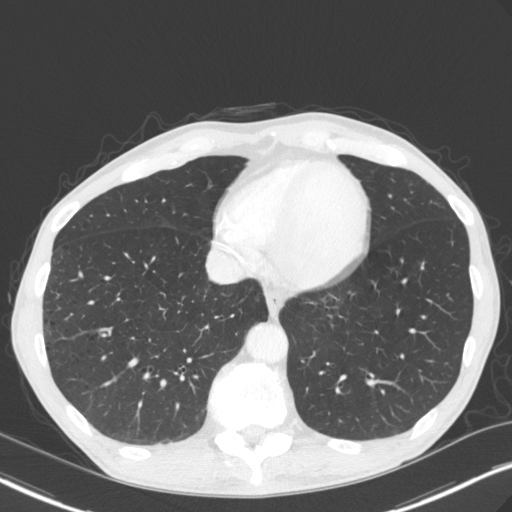
[im 53/176  lung]
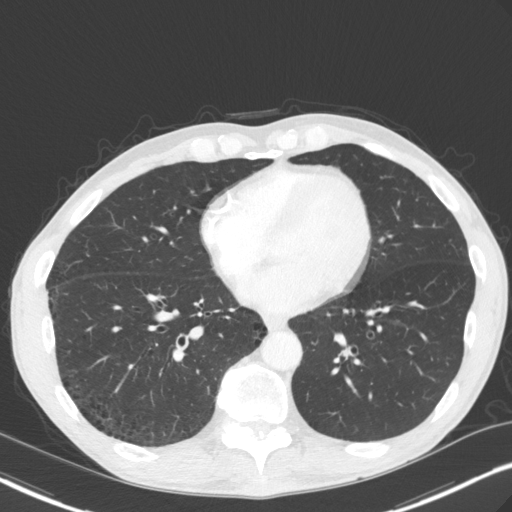
[im 71/176  lung]
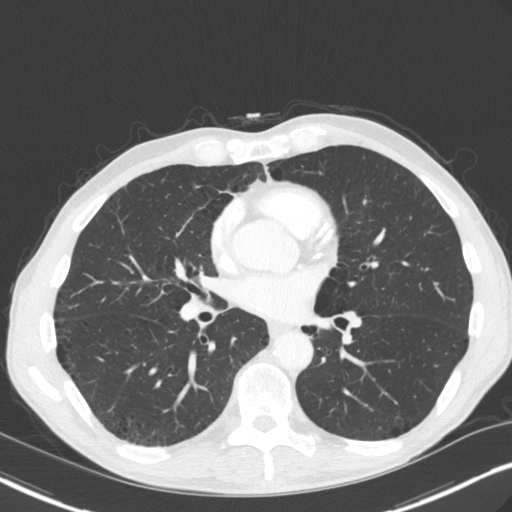
[im 88/176  mediastinal]
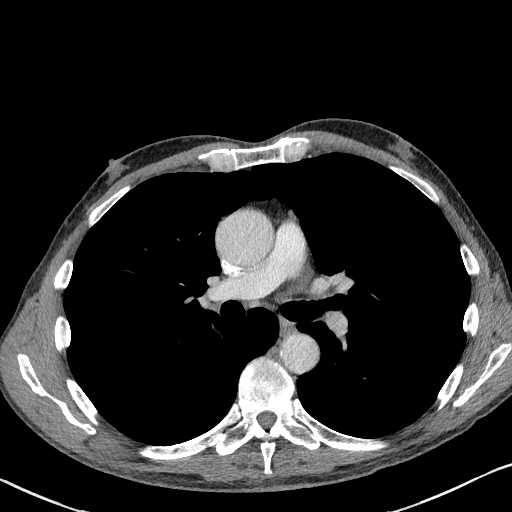
[im 88/176  lung]
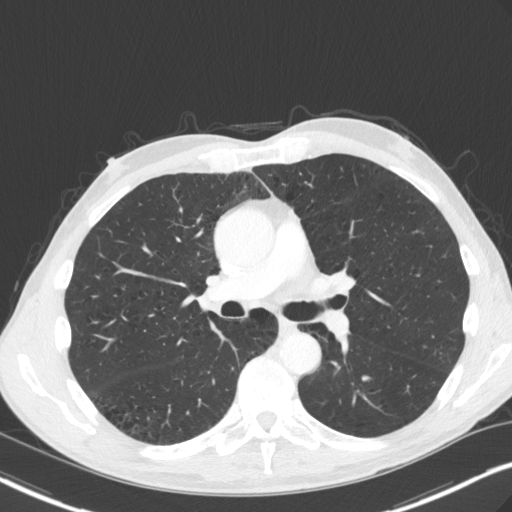
[im 106/176  lung]
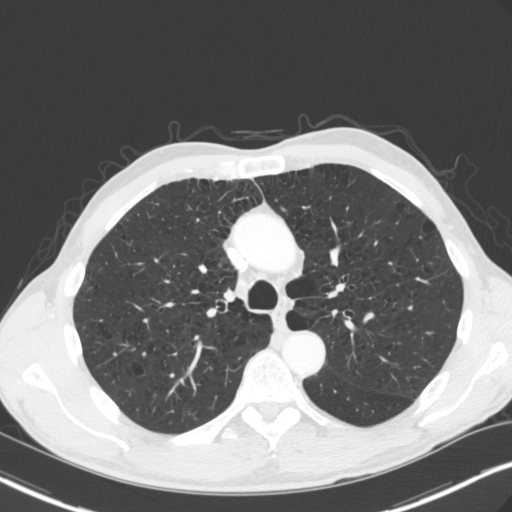
[im 123/176  lung]
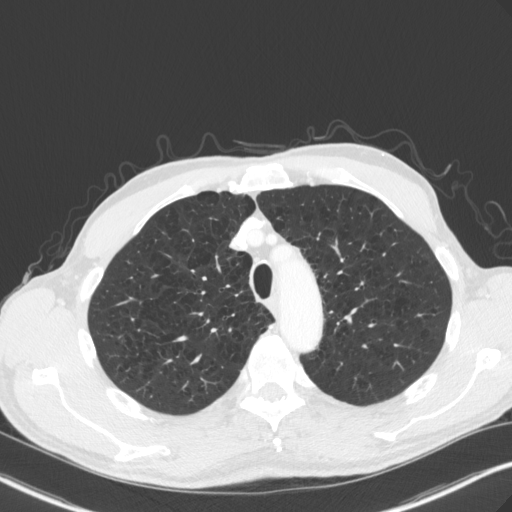
[im 141/176  lung]
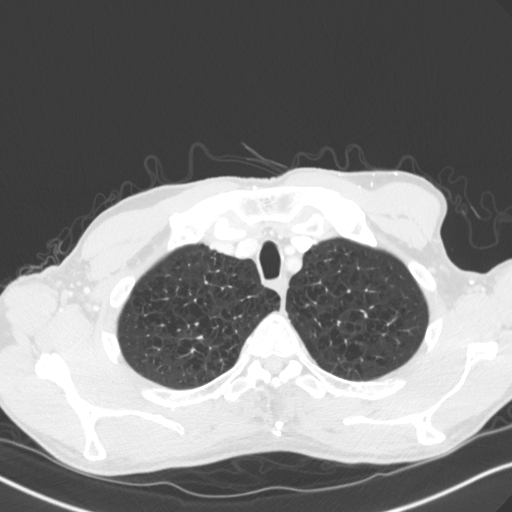
[im 158/176  mediastinal]
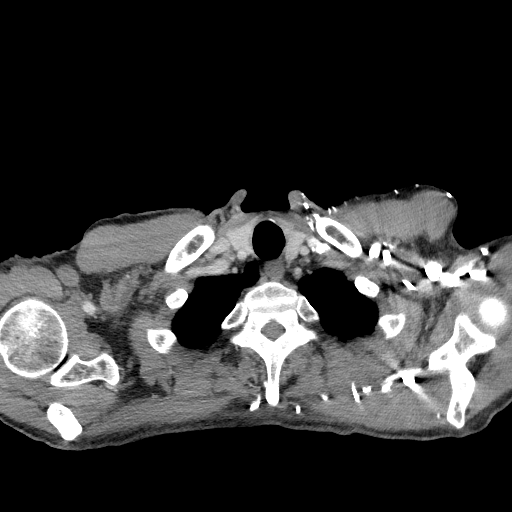
[im 158/176  lung]
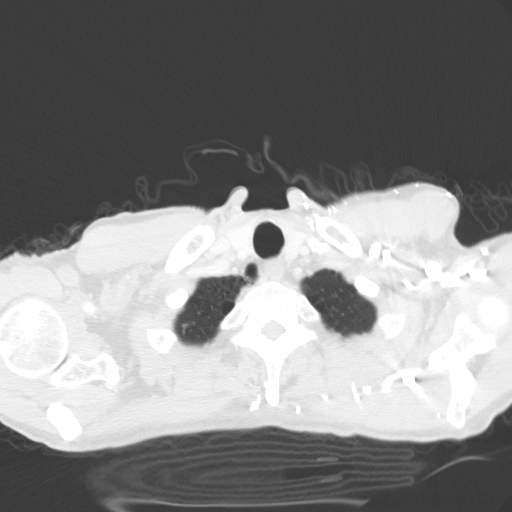

[Series 11: coronal neck · coronal · 0.37mm/px · 1 of 114 slices shown]
[im 57/114  lung]
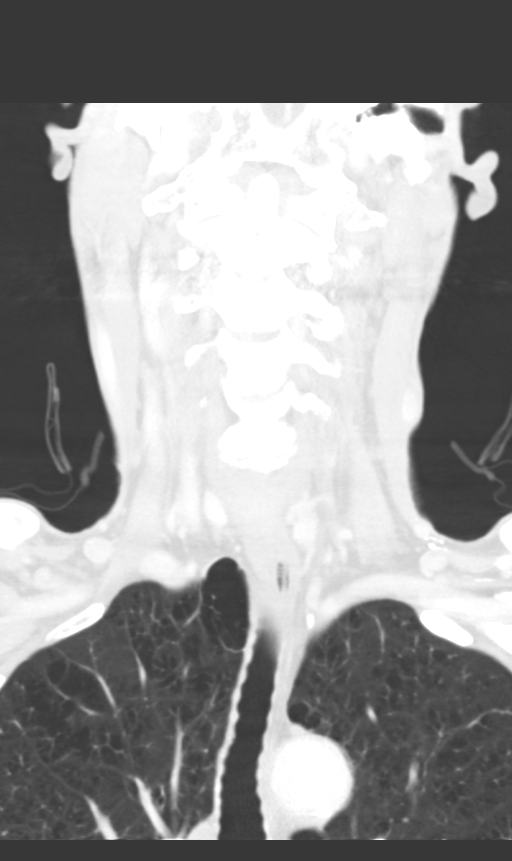

[Series 13: orthogonal ax · axial · 0.39mm/px · z∈[-163,-55]mm · 4 of 156 slices shown]
[im 20/156  lung]
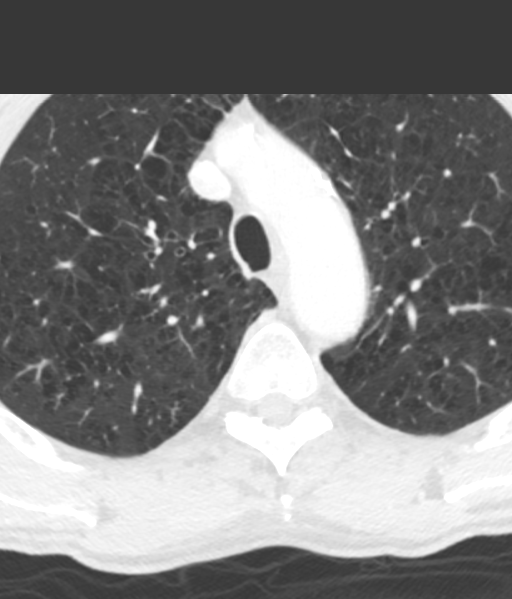
[im 39/156  lung]
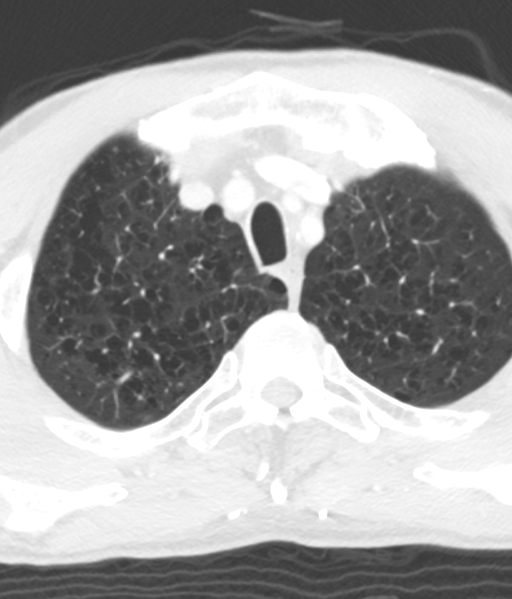
[im 59/156  lung]
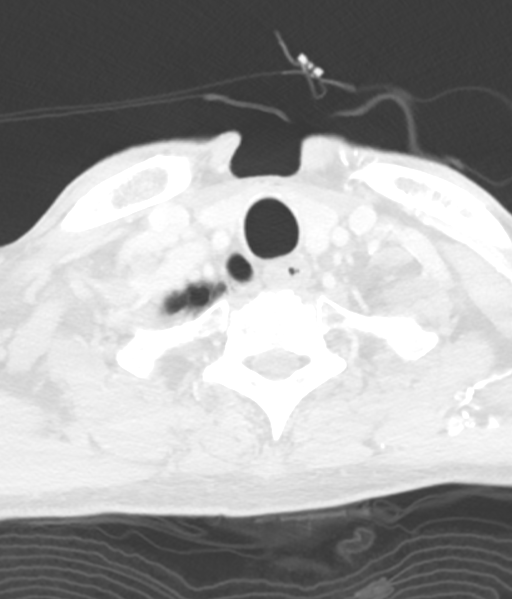
[im 78/156  lung]
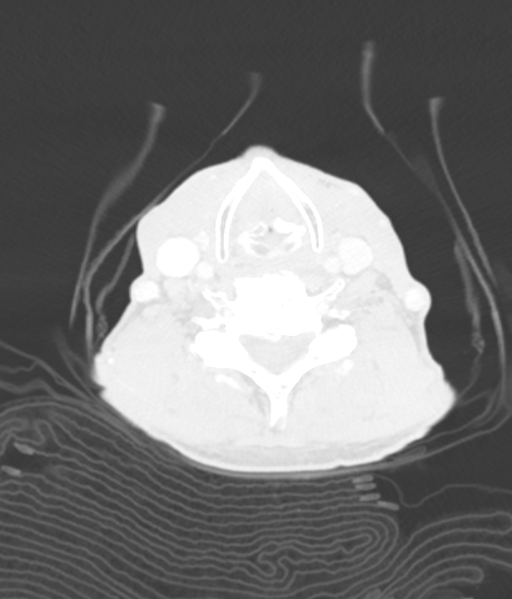

[14 of 36 positions shown; findings below may reference images not displayed]

FINDINGS: Cardiovascular: The heart size is normal. No pericardial effusion.
Coronary artery calcification is evident. Ascending thoracic aorta
measures up to 4.2 cm diameter. Atherosclerotic calcification is
noted in the wall of the thoracic aorta.

Mediastinum/Nodes: No mediastinal lymphadenopathy. There is no hilar
lymphadenopathy. The esophagus has normal imaging features. There is
no axillary lymphadenopathy.

Lungs/Pleura: Centrilobular and paraseptal emphysema noted. Interval
development of irregular 7 x 10 mm right lower lobe nodule (image
131/series 4). Stable 4 mm posterior left lower lobe subpleural
nodule (121/4). No focal airspace consolidation. No pulmonary edema
or pleural effusion.

Upper Abdomen: Similar appearance of bilateral adrenal gland
thickening since prior study and comparing back to 06/26/2015.

Musculoskeletal: Bone windows reveal no worrisome lytic or sclerotic
osseous lesions.
IMPRESSION: 1. Interval development of 7 x 10 mm right lower lobe pulmonary
nodule. Consider one of the following in 3 months for both low-risk
and high-risk individuals: (a) repeat chest CT, (b) follow-up
PET-CT, or (c) tissue sampling. This recommendation follows the
consensus statement: Guidelines for Management of Incidental
Pulmonary Nodules Detected on CT Images: From the [HOSPITAL]
2.  Emphysema. (S8M0M-5GM.2)
3.  Aortic Atherosclerois (S8M0M-170.0)
4. 4.2 cm diameter ascending thoracic aortic aneurysm. Recommend
annual imaging followup by CTA or MRA. This recommendation follows
1383 ACCF/AHA/AATS/ACR/ASA/SCA/NOVI/JUMPER/SIN/JASMINE Guidelines for the
Diagnosis and Management of Patients with Thoracic Aortic Disease.
Circulation. 1383; 121: [id number]

These results will be called to the ordering clinician or
representative by the Radiologist Assistant, and communication
documented in the PACS or zVision Dashboard.

## 2019-11-19 IMAGING — CT CT CHEST W/O CM
2 of 3 series · 15 of 36 positions shown, 18 images · non-contrast
Comparison: 12/20/2017

CLINICAL DATA: Chronic shortness of breath. Emphysema. Laryngeal
cancer with radiation therapy.

EXAM:
CT CHEST WITHOUT CONTRAST
TECHNIQUE: Multidetector CT imaging of the chest was performed following the
standard protocol without IV contrast.

[Series 2: thorax · axial · 0.70mm/px · z∈[-220,+86]mm · 12 of 181 slices shown, 15 images]
[im 14/181  mediastinal]
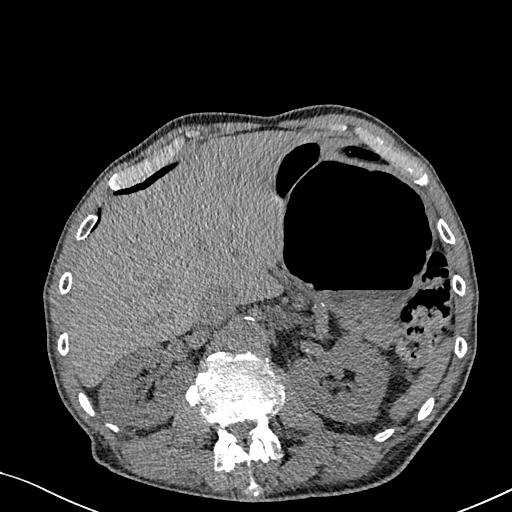
[im 14/181  lung]
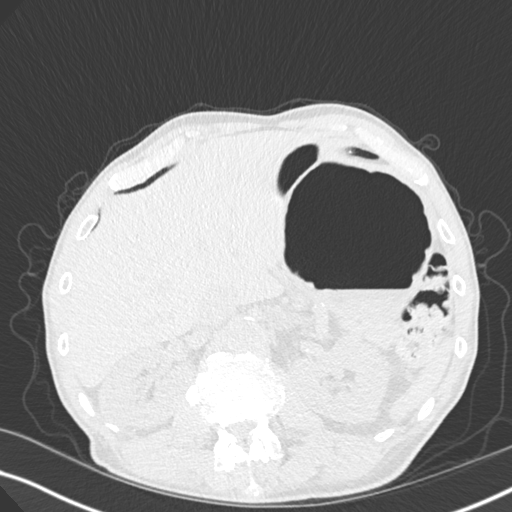
[im 27/181  lung]
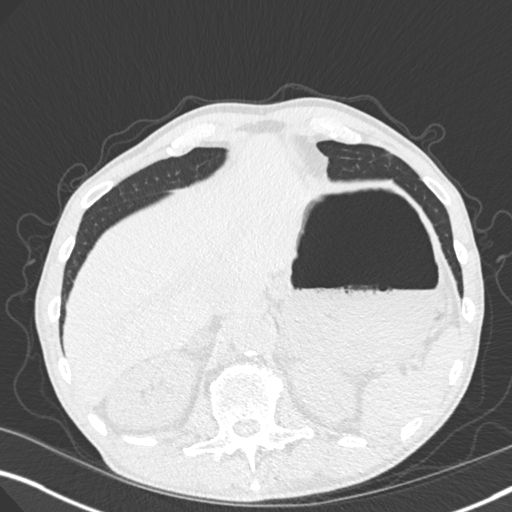
[im 41/181  lung]
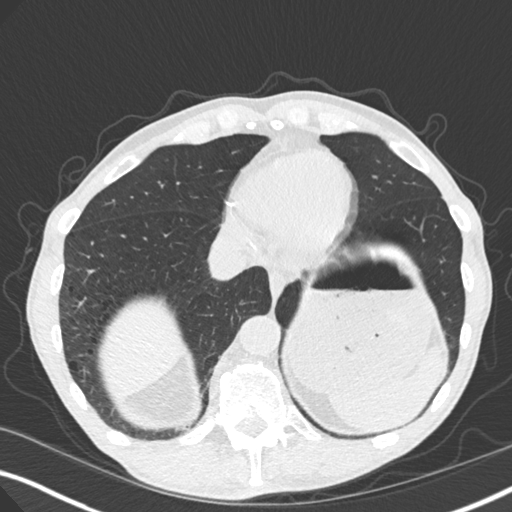
[im 54/181  lung]
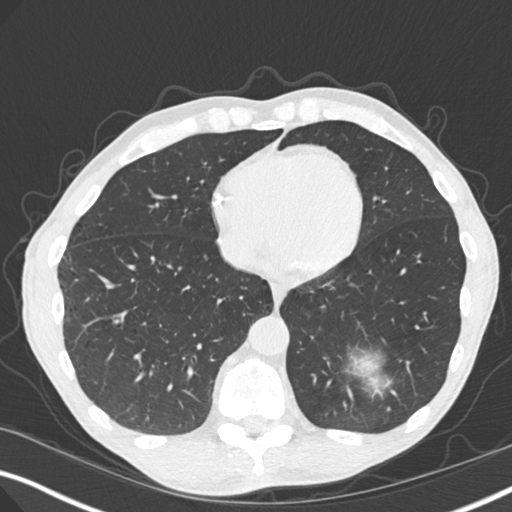
[im 67/181  mediastinal]
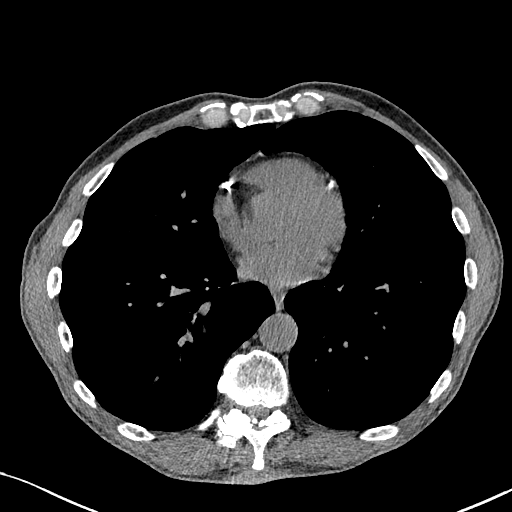
[im 67/181  lung]
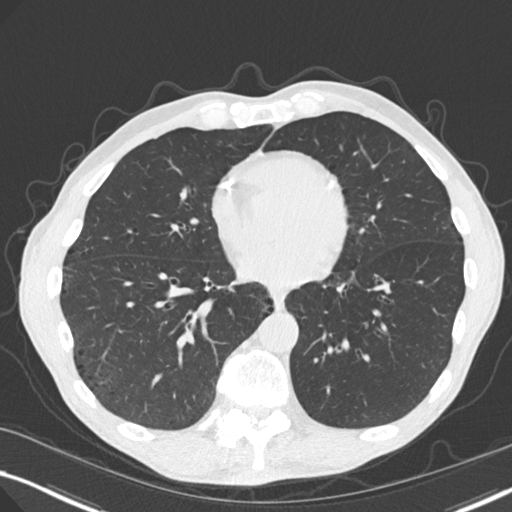
[im 81/181  lung]
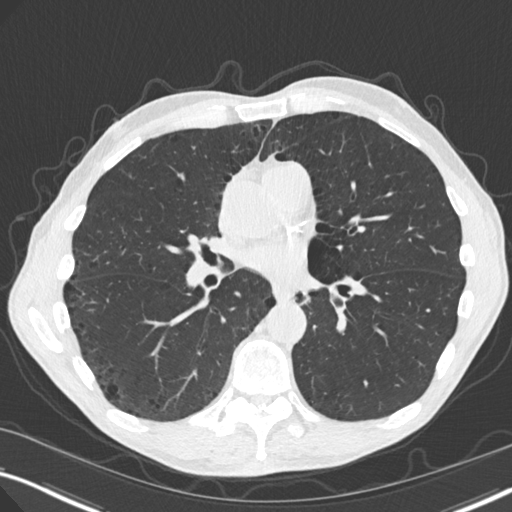
[im 101/181  lung]
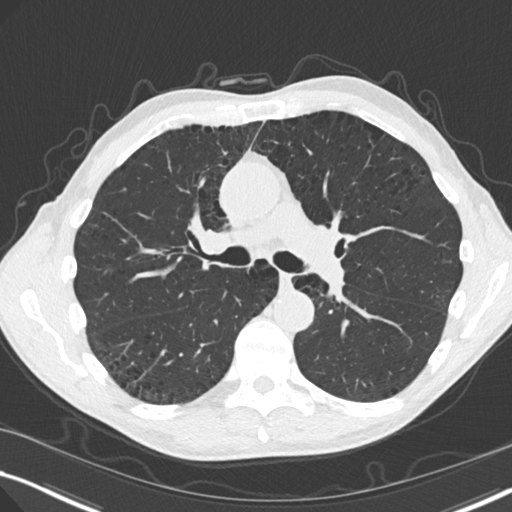
[im 114/181  lung]
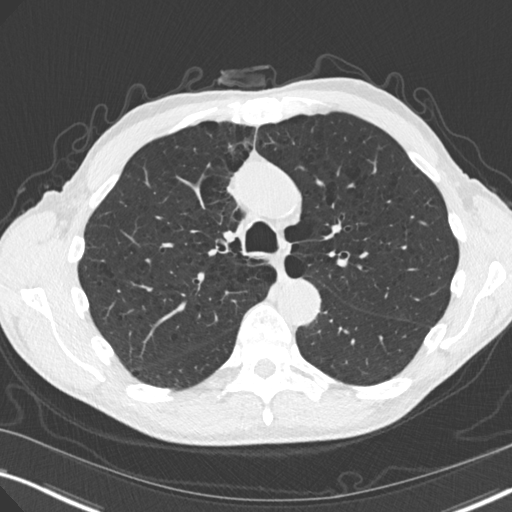
[im 127/181  mediastinal]
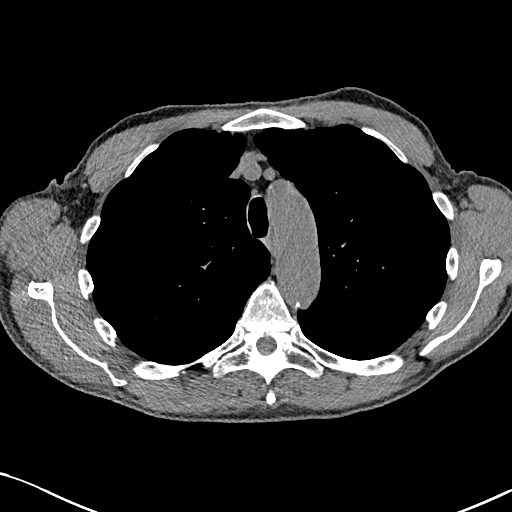
[im 127/181  lung]
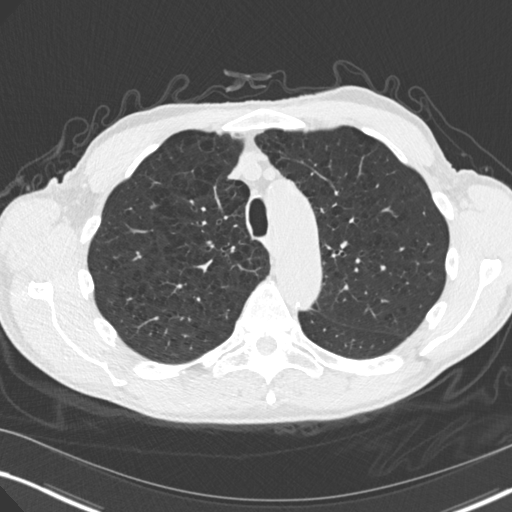
[im 141/181  lung]
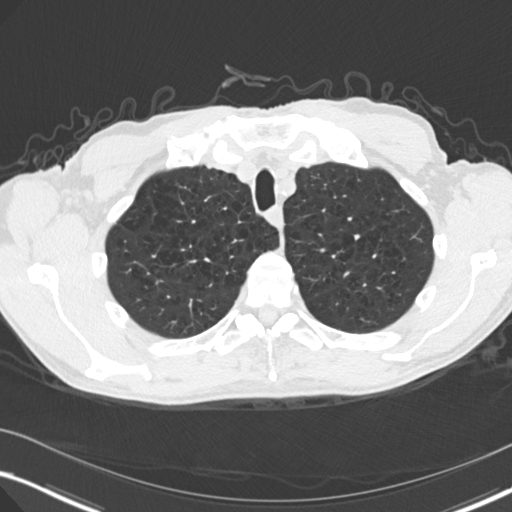
[im 154/181  lung]
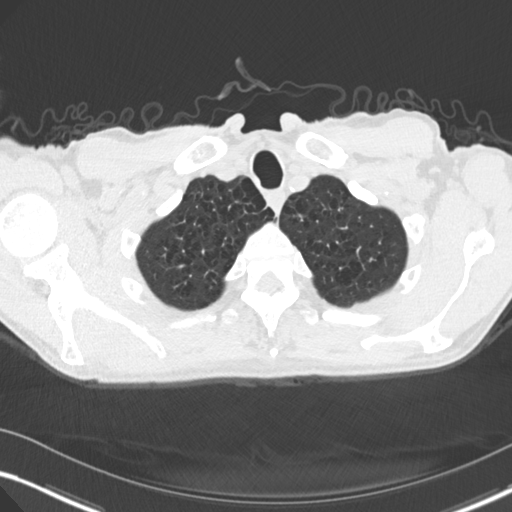
[im 167/181  lung]
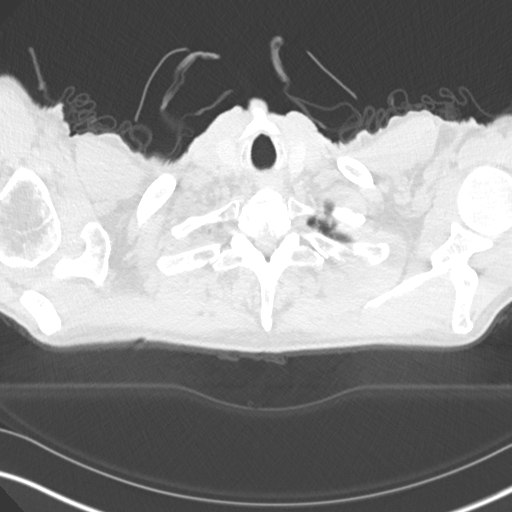

[Series 5: coronal · coronal · 0.68mm/px · 3 of 138 slices shown]
[im 28/138  lung]
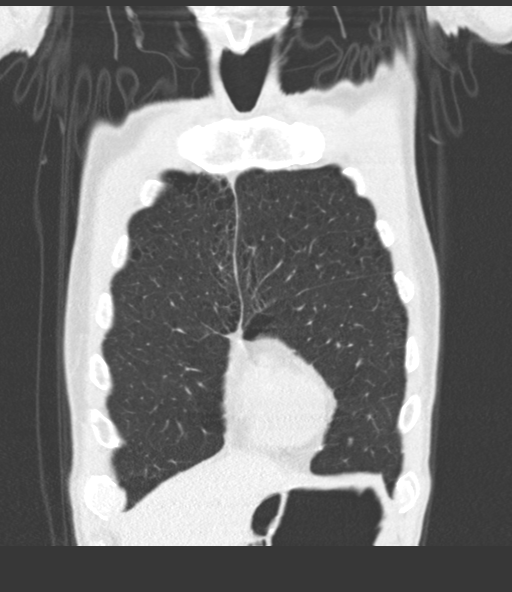
[im 55/138  lung]
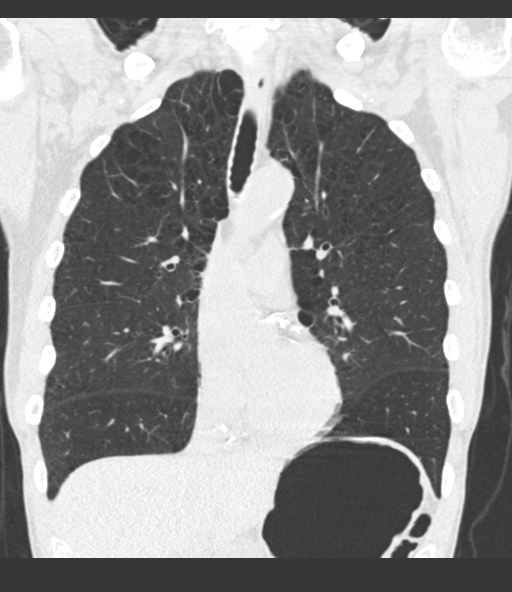
[im 83/138  lung]
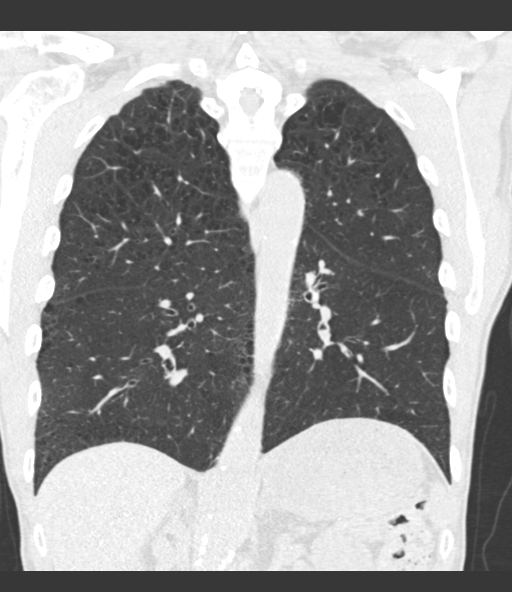

[15 of 36 positions shown; findings below may reference images not displayed]

FINDINGS: Cardiovascular: Borderline ascending aortic dilatation at 4.0 cm.
Aortic atherosclerosis. Tortuous thoracic aorta. Normal heart size,
without pericardial effusion. Multivessel coronary artery
atherosclerosis.

Mediastinum/Nodes: No mediastinal or definite hilar adenopathy,
given limitations of unenhanced CT.

Lungs/Pleura: No pleural fluid.  Moderate bullous type emphysema.

Spiculated right lower lobe pulmonary nodule of 6 x 6 mm on 123/4.
This is felt to be similar to the prior exam (when remeasured).

A subpleural left lower lobe pulmonary nodule is new at 4 mm on
98/4.

Upper Abdomen: Normal imaged portions of the liver, spleen, stomach,
pancreas, left kidney. A 4 mm hyperattenuating upper pole right
renal lesion is too small to characterize but most likely a complex
cyst. Both adrenal glands are thickened, suggesting hyperplasia.
Abdominal aortic and branch vessel atherosclerosis.

Musculoskeletal: Mild thoracic spondylosis.
IMPRESSION: 1. No acute process in the chest.
2. Aortic atherosclerosis (H1ZOD-FIJ.J), coronary artery
atherosclerosis and emphysema (H1ZOD-IF1.4). No other explanation
for shortness of breath.
3. Similar spiculated right lower lobe pulmonary nodule over 3
months. New subpleural 4 mm left lower lobe pulmonary nodule. These
are nonspecific. In the setting of primary malignancy, Fleischner
criteria do not apply. Consider follow-up chest CT at 6 months.
4. Borderline ascending aortic dilatation. Recommend attention on
follow-up.
